# Patient Record
Sex: Male | Born: 1965 | Race: Black or African American | Hispanic: No | Smoking: Current every day smoker
Health system: Southern US, Community
[De-identification: ages and names within clinical notes are randomized; demographics above are authoritative.]

## PROBLEM LIST (undated history)

## (undated) DIAGNOSIS — I10 Essential (primary) hypertension: Secondary | ICD-10-CM

## (undated) DIAGNOSIS — R000 Tachycardia, unspecified: Secondary | ICD-10-CM

## (undated) DIAGNOSIS — R1310 Dysphagia, unspecified: Secondary | ICD-10-CM

## (undated) DIAGNOSIS — I610 Nontraumatic intracerebral hemorrhage in hemisphere, subcortical: Secondary | ICD-10-CM

## (undated) DIAGNOSIS — R471 Dysarthria and anarthria: Secondary | ICD-10-CM

## (undated) DIAGNOSIS — I714 Abdominal aortic aneurysm, without rupture, unspecified: Secondary | ICD-10-CM

## (undated) DIAGNOSIS — R0682 Tachypnea, not elsewhere classified: Secondary | ICD-10-CM

## (undated) HISTORY — PX: APPENDECTOMY: SHX54

## (undated) HISTORY — PX: ABDOMINAL SURGERY: SHX537

---

## 2014-06-11 ENCOUNTER — Emergency Department (HOSPITAL_COMMUNITY): Payer: Non-veteran care

## 2014-06-11 ENCOUNTER — Inpatient Hospital Stay (HOSPITAL_COMMUNITY): Payer: Non-veteran care

## 2014-06-11 ENCOUNTER — Encounter (HOSPITAL_COMMUNITY): Payer: Self-pay | Admitting: Emergency Medicine

## 2014-06-11 ENCOUNTER — Inpatient Hospital Stay (HOSPITAL_COMMUNITY)
Admission: EM | Admit: 2014-06-11 | Discharge: 2014-06-14 | DRG: 066 | Disposition: A | Discharge status: Home / Self Care | Payer: Non-veteran care | Attending: Neurology | Admitting: Neurology

## 2014-06-11 DIAGNOSIS — I714 Abdominal aortic aneurysm, without rupture: Secondary | ICD-10-CM | POA: Diagnosis present

## 2014-06-11 DIAGNOSIS — E669 Obesity, unspecified: Secondary | ICD-10-CM | POA: Diagnosis present

## 2014-06-11 DIAGNOSIS — E785 Hyperlipidemia, unspecified: Secondary | ICD-10-CM | POA: Diagnosis not present

## 2014-06-11 DIAGNOSIS — I6789 Other cerebrovascular disease: Secondary | ICD-10-CM

## 2014-06-11 DIAGNOSIS — H532 Diplopia: Secondary | ICD-10-CM | POA: Diagnosis not present

## 2014-06-11 DIAGNOSIS — H52511 Internal ophthalmoplegia (complete) (total), right eye: Secondary | ICD-10-CM | POA: Diagnosis present

## 2014-06-11 DIAGNOSIS — Z6833 Body mass index (BMI) 33.0-33.9, adult: Secondary | ICD-10-CM | POA: Diagnosis not present

## 2014-06-11 DIAGNOSIS — Z720 Tobacco use: Secondary | ICD-10-CM | POA: Diagnosis not present

## 2014-06-11 DIAGNOSIS — I619 Nontraumatic intracerebral hemorrhage, unspecified: Secondary | ICD-10-CM

## 2014-06-11 DIAGNOSIS — Z9119 Patient's noncompliance with other medical treatment and regimen: Secondary | ICD-10-CM | POA: Diagnosis present

## 2014-06-11 DIAGNOSIS — F17210 Nicotine dependence, cigarettes, uncomplicated: Secondary | ICD-10-CM | POA: Diagnosis present

## 2014-06-11 DIAGNOSIS — I610 Nontraumatic intracerebral hemorrhage in hemisphere, subcortical: Secondary | ICD-10-CM | POA: Diagnosis not present

## 2014-06-11 DIAGNOSIS — I613 Nontraumatic intracerebral hemorrhage in brain stem: Principal | ICD-10-CM

## 2014-06-11 DIAGNOSIS — I10 Essential (primary) hypertension: Secondary | ICD-10-CM | POA: Diagnosis present

## 2014-06-11 DIAGNOSIS — Z9114 Patient's other noncompliance with medication regimen: Secondary | ICD-10-CM | POA: Diagnosis present

## 2014-06-11 DIAGNOSIS — R200 Anesthesia of skin: Secondary | ICD-10-CM | POA: Diagnosis present

## 2014-06-11 HISTORY — DX: Essential (primary) hypertension: I10

## 2014-06-11 LAB — CBC WITH DIFFERENTIAL/PLATELET
Basophils Absolute: 0.1 10*3/uL (ref 0.0–0.1)
Basophils Relative: 1 % (ref 0–1)
EOS ABS: 0.2 10*3/uL (ref 0.0–0.7)
Eosinophils Relative: 3 % (ref 0–5)
HCT: 45.8 % (ref 39.0–52.0)
Hemoglobin: 15.6 g/dL (ref 13.0–17.0)
Lymphocytes Relative: 23 % (ref 12–46)
Lymphs Abs: 1.4 10*3/uL (ref 0.7–4.0)
MCH: 31.4 pg (ref 26.0–34.0)
MCHC: 34.1 g/dL (ref 30.0–36.0)
MCV: 92.2 fL (ref 78.0–100.0)
MONO ABS: 0.7 10*3/uL (ref 0.1–1.0)
MONOS PCT: 12 % (ref 3–12)
NEUTROS PCT: 61 % (ref 43–77)
Neutro Abs: 3.7 10*3/uL (ref 1.7–7.7)
PLATELETS: 279 10*3/uL (ref 150–400)
RBC: 4.97 MIL/uL (ref 4.22–5.81)
RDW: 14.8 % (ref 11.5–15.5)
WBC: 6 10*3/uL (ref 4.0–10.5)

## 2014-06-11 LAB — URINALYSIS, ROUTINE W REFLEX MICROSCOPIC
Bilirubin Urine: NEGATIVE
GLUCOSE, UA: NEGATIVE mg/dL
Ketones, ur: NEGATIVE mg/dL
Leukocytes, UA: NEGATIVE
NITRITE: NEGATIVE
PROTEIN: 100 mg/dL — AB
Specific Gravity, Urine: 1.013 (ref 1.005–1.030)
UROBILINOGEN UA: 0.2 mg/dL (ref 0.0–1.0)
pH: 6 (ref 5.0–8.0)

## 2014-06-11 LAB — COMPREHENSIVE METABOLIC PANEL
ALT: 24 U/L (ref 0–53)
ANION GAP: 9 (ref 5–15)
AST: 27 U/L (ref 0–37)
Albumin: 3.6 g/dL (ref 3.5–5.2)
Alkaline Phosphatase: 77 U/L (ref 39–117)
BUN: 11 mg/dL (ref 6–23)
CALCIUM: 8.9 mg/dL (ref 8.4–10.5)
CO2: 27 mmol/L (ref 19–32)
Chloride: 101 mmol/L (ref 96–112)
Creatinine, Ser: 1.25 mg/dL (ref 0.50–1.35)
GFR calc Af Amer: 77 mL/min — ABNORMAL LOW (ref >90)
GFR, EST NON AFRICAN AMERICAN: 67 mL/min — AB (ref >90)
Glucose, Bld: 109 mg/dL — ABNORMAL HIGH (ref 70–99)
Potassium: 3.4 mmol/L — ABNORMAL LOW (ref 3.5–5.1)
SODIUM: 137 mmol/L (ref 135–145)
TOTAL PROTEIN: 7.4 g/dL (ref 6.0–8.3)
Total Bilirubin: 0.7 mg/dL (ref 0.3–1.2)

## 2014-06-11 LAB — RAPID URINE DRUG SCREEN, HOSP PERFORMED
Amphetamines: NOT DETECTED
BARBITURATES: NOT DETECTED
BENZODIAZEPINES: NOT DETECTED
COCAINE: NOT DETECTED
Opiates: NOT DETECTED
Tetrahydrocannabinol: NOT DETECTED

## 2014-06-11 LAB — MRSA PCR SCREENING: MRSA BY PCR: NEGATIVE

## 2014-06-11 LAB — ETHANOL

## 2014-06-11 LAB — URINE MICROSCOPIC-ADD ON

## 2014-06-11 LAB — PROTIME-INR
INR: 0.92 (ref 0.00–1.49)
Prothrombin Time: 12.5 seconds (ref 11.6–15.2)

## 2014-06-11 LAB — TROPONIN I

## 2014-06-11 MED ORDER — ONDANSETRON HCL 4 MG/2ML IJ SOLN
4.0000 mg | Freq: Three times a day (TID) | INTRAMUSCULAR | Status: DC | PRN
  Administered 2014-06-11: 4 mg via INTRAVENOUS
  Filled 2014-06-11: qty 2

## 2014-06-11 MED ORDER — LABETALOL HCL 5 MG/ML IV SOLN: 10.0000 mg | INTRAVENOUS | Status: DC | PRN

## 2014-06-11 MED ORDER — PANTOPRAZOLE SODIUM 40 MG IV SOLR
40.0000 mg | Freq: Every day | INTRAVENOUS | Status: DC
Start: 2014-06-11 — End: 2014-06-12
  Administered 2014-06-11: 40 mg via INTRAVENOUS
  Filled 2014-06-11 (×2): qty 40

## 2014-06-11 MED ORDER — ACETAMINOPHEN 650 MG RE SUPP: 650.0000 mg | RECTAL | Status: DC | PRN

## 2014-06-11 MED ORDER — LISINOPRIL 10 MG PO TABS
10.0000 mg | ORAL_TABLET | Freq: Every day | ORAL | Status: DC
  Administered 2014-06-11 – 2014-06-12 (×2): 10 mg via ORAL
  Filled 2014-06-11 (×2): qty 1

## 2014-06-11 MED ORDER — NICARDIPINE HCL IN NACL 20-0.86 MG/200ML-% IV SOLN
3.0000 mg/h | INTRAVENOUS | Status: DC
  Administered 2014-06-11: 5 mg/h via INTRAVENOUS
  Administered 2014-06-11: 3 mg/h via INTRAVENOUS
  Filled 2014-06-11 (×5): qty 200

## 2014-06-11 MED ORDER — SENNOSIDES-DOCUSATE SODIUM 8.6-50 MG PO TABS
1.0000 | ORAL_TABLET | Freq: Two times a day (BID) | ORAL | Status: DC
  Administered 2014-06-11 – 2014-06-14 (×6): 1 via ORAL
  Filled 2014-06-11 (×8): qty 1

## 2014-06-11 MED ORDER — LABETALOL HCL 5 MG/ML IV SOLN
10.0000 mg | Freq: Once | INTRAVENOUS | Status: AC
  Administered 2014-06-11: 10 mg via INTRAVENOUS
  Filled 2014-06-11: qty 4

## 2014-06-11 MED ORDER — ACETAMINOPHEN 325 MG PO TABS: 650.0000 mg | ORAL_TABLET | ORAL | Status: DC | PRN

## 2014-06-11 MED ORDER — STROKE: EARLY STAGES OF RECOVERY BOOK
Freq: Once | Status: AC
Start: 2014-06-11 — End: 2014-06-11
  Administered 2014-06-11: 1
  Filled 2014-06-11: qty 1

## 2014-06-11 MED ORDER — NICARDIPINE HCL IN NACL 20-0.86 MG/200ML-% IV SOLN
3.0000 mg/h | Freq: Once | INTRAVENOUS | Status: AC
  Administered 2014-06-11: 5 mg/h via INTRAVENOUS
  Filled 2014-06-11: qty 200

## 2014-06-11 MED ORDER — CLONIDINE HCL 0.1 MG PO TABS
0.1000 mg | ORAL_TABLET | Freq: Two times a day (BID) | ORAL | Status: DC
  Administered 2014-06-11 – 2014-06-12 (×3): 0.1 mg via ORAL
  Filled 2014-06-11 (×4): qty 1

## 2014-06-11 NOTE — Progress Notes (Signed)
Patient brought to me from MRI. Cardene drip not running and primed through secondary tubing. Compliant of nausea and progressed to vomited 300cc. NIH, complaint of headache 3 out of 10. Contacted MD due to concerns for ?increaseing ICP based on above findings. Although patient alert and oriented. MD has reviewed readings and per MD. Based on the location of CVA, patient will experience nausea and vomiting. Per MD, give zofran for now and restart cardene based on current parameters.

## 2014-06-11 NOTE — H&P (Addendum)
Stroke Consult    Chief Complaint: numbness HPI: Andre [name] is an 49 y.o. male hx of HTN presenting to the ED for right sided numbness involving face and arm. States he has pain on the right side of his face. Denies any weakness. Denies any speech issues. Notes difficulty with moving his eyes to the right. No prior CVA or TIA. Notes history of HTN but is non-compliant with medications.   CT head in the ED completed and reviewed. Shows 9mm ICH in the posterior pons and midbrain. BP has remained elevated in the ED, ranging SBP 188 to 198.    Past Medical History  Diagnosis Date  . Hypertension     Past Surgical History  Procedure Laterality Date  . Appendectomy    . Abdominal surgery      No family history on file. Social History:  reports that he has been smoking.  He does not have any smokeless tobacco history on file. He reports that he drinks alcohol. He reports that he does not use illicit drugs.  Allergies: No Known Allergies   (Not in a hospital admission)  ROS: Out of a complete 14 system review, the patient complains of only the following symptoms, and all other reviewed systems are negative. +paresthesias, weakness   Physical Examination: Filed Vitals:   06/11/14 0400  BP: 197/117  Pulse: 77  Temp:   Resp: 20   Physical Exam  Constitutional: He appears well-developed and well-nourished.  Psych: Affect appropriate to situation Eyes: No scleral injection HENT: No OP obstrucion Head: Normocephalic.  Cardiovascular: Normal rate and regular rhythm.  Respiratory: Effort normal and breath sounds normal.  GI: Soft. Bowel sounds are normal. No distension. There is no tenderness.  Skin: WDI  Neurologic Examination: Mental Status: Lethargic but easily aroused by voice, oriented, thought content appropriate.  Limited speech but appears fluent without evidence of aphasia.  Mild dysarthria.  Cranial Nerves: II: optic discs not visualized, visual fields  grossly normal, pupils equal, round, reactive to light  III,IV, VI: mild bilateral ptosis, left gaze preference, intermittently appears able to move eyes lateral to the right V,VII: left NLF flattening, facial light touch sensation normal bilaterally VIII: hearing normal bilaterally IX,X: gag reflex present XI: trapezius strength/neck flexion strength normal bilaterally XII: tongue strength normal  Motor: Right : Upper extremity    Left:     Upper extremity 5-/5 deltoid       5/5 deltoid 5-/5 biceps      5/5 biceps  5/5 triceps      5/5 triceps 5/5 hand grip      5/5 hand grip  Lower extremity     Lower extremity 5/5 hip flexor      5/5 hip flexor 5/5 quadricep      5/5 quadriceps  5/5 hamstrings     5/5 hamstrings 5/5 plantar flexion       5/5 plantar flexion 5/5 plantar extension     5/5 plantar extension Tone and bulk:normal tone throughout; no atrophy noted Sensory: decreased LT on the right Deep Tendon Reflexes: 2+ biceps, triceps and patellar Plantars: Right: downgoing   Left: downgoing Cerebellar: normal finger-to-nose, and normal heel-to-shin test Gait: deferred  Laboratory Studies:   Basic Metabolic Panel:  Recent Labs Lab 06/11/14 0341  NA 137  K 3.4*  CL 101  CO2 27  GLUCOSE 109*  BUN PENDING  CREATININE 1.25  CALCIUM 8.9    Liver Function Tests:  Recent Labs Lab 06/11/14 0341  AST  27  ALT 24  ALKPHOS 77  BILITOT 0.7  PROT 7.4  ALBUMIN 3.6   No results for input(s): LIPASE, AMYLASE in the last 168 hours. No results for input(s): AMMONIA in the last 168 hours.  CBC:  Recent Labs Lab 06/11/14 0341  WBC 6.0  NEUTROABS 3.7  HGB 15.6  HCT 45.8  MCV 92.2  PLT 279    Cardiac Enzymes:  Recent Labs Lab 06/11/14 0341  TROPONINI <0.03    BNP: Invalid input(s): POCBNP  CBG: No results for input(s): GLUCAP in the last 168 hours.  Microbiology: No results found for this or any previous visit.  Coagulation Studies:  Recent Labs   06/11/14 0341  LABPROT 12.5  INR 0.92    Urinalysis: No results for input(s): COLORURINE, LABSPEC, PHURINE, GLUCOSEU, HGBUR, BILIRUBINUR, KETONESUR, PROTEINUR, UROBILINOGEN, NITRITE, LEUKOCYTESUR in the last 168 hours.  Invalid input(s): APPERANCEUR  Lipid Panel:  No results found for: CHOL, TRIG, HDL, CHOLHDL, VLDL, LDLCALC  HgbA1C: No results found for: HGBA1C  Urine Drug Screen:  No results found for: LABOPIA, COCAINSCRNUR, LABBENZ, AMPHETMU, THCU, LABBARB  Alcohol Level:  Recent Labs Lab 06/11/14 0341  ETH <5    Other results: EKG: normal EKG, normal sinus rhythm.  Imaging: Ct Head Wo Contrast  06/11/2014   CLINICAL DATA:  Numbness in the right face and left arm.  EXAM: CT HEAD WITHOUT CONTRAST  TECHNIQUE: Contiguous axial images were obtained from the base of the skull through the vertex without intravenous contrast.  COMPARISON:  None.  FINDINGS: 9 mm diameter focal intraparenchymal hemorrhage in the right posterior pons and midbrain. This likely represents a focal hemorrhagic infarct. No mass effect or midline shift. Diffuse cerebral atrophy. Mild ventricular dilatation consistent with central atrophy. Low-attenuation changes in the deep white matter consistent small vessel ischemia. No abnormal extra-axial fluid collections. Gray-white matter junctions are distinct. Basal cisterns are not effaced. Calvarium is intact. Visualized paranasal sinuses and mastoid air cells are not opacified.  IMPRESSION: Focal acute intraparenchymal hemorrhage in the posterior pons and midbrain. This is likely represent focal hemorrhagic infarct. Chronic atrophy and small vessel ischemic changes.  These results were called by telephone at the time of interpretation on 06/11/2014 at 3:58 am to Dr. Loren RacerAVID YELVERTON , who verbally acknowledged these results.   Electronically Signed   By: Burman NievesWilliam  Stevens M.D.   On: 06/11/2014 04:00    Assessment: 49 y.o. male hx of HTN, not compliant with medications,  presenting with acute onset of right sided paresthesia and occular motility dysfunction. CT head in ED shows acute ICH in posterior pons/midbrain region. Suspect hypertensive etiology.    Plan: 1) Admit to ICU 2)MRI/A of brain without contrast 3) no antiplatelets or anticoagulants 4) blood pressure control with goal systolic <160. Started on cardene drip in ED 5) Frequent neuro checks 6) If symptoms worsen or there is decreased mental status, repeat stat head CT 7) PT,OT,ST 8) Echocardiogram 9) Carotid dopplers   This patient is critically ill and at significant risk of neurological worsening, death and care requires constant monitoring of vital signs, hemodynamics,respiratory and cardiac monitoring,review of multiple databases, neurological assessment, discussion with family, other specialists and medical decision making of high complexity. I spent 45 inutes of neurocritical care time in the care of this patient.   Elspeth Choeter Terez Montee, DO [facility] [phone]  If 7pm- 7am, please page neurology on call as listed in AMION. 06/11/2014, 4:27 AM

## 2014-06-11 NOTE — Progress Notes (Signed)
Stroke Team Progress Note  HISTORY Zenovia Jarredheodore W Clere is an 49 y.o. male hx of HTN presenting to the ED for right sided numbness involving face and arm. States he has pain on the right side of his face. Denies any weakness. Denies any speech issues. Notes difficulty with moving his eyes to the right. No prior CVA or TIA. Notes history of HTN but is non-compliant with medications.   CT head in the ED completed and reviewed. Shows 9mm ICH in the posterior pons and midbrain. BP has remained elevated in the ED, ranging SBP 188 to 198.   No TPA due to ICH.  SUBJECTIVE No family is at bedside. The patient had some nausea and vomiting last evening, feels well at this time. No significant headache. He reports numbness on the right face, left arm.  The patient indicates [facility], he has been off 2 blood pressure medications for least 6 months.  OBJECTIVE Most recent Vital Signs: Filed Vitals:   06/11/14 0900 06/11/14 0930 06/11/14 1000 06/11/14 1030  BP: 131/82 121/75 135/87 135/88  Pulse: 91 80 84 81  Temp:      TempSrc:      Resp: 21 19 20 19   Height:      Weight:      SpO2: 98% 98% 98% 99%   CBG (last 3)  No results for input(s): GLUCAP in the last 72 hours.  IV Fluid Intake:   . niCARDipine 3 mg/hr (06/11/14 0900)    MEDICATIONS  .  stroke: mapping our early stages of recovery book   Does not apply Once  . pantoprazole (PROTONIX) IV  40 mg Intravenous QHS  . senna-docusate  1 tablet Oral BID   PRN:  acetaminophen **OR** acetaminophen, labetalol, ondansetron  Diet:  Heart healthy diet Activity:  Bedrest DVT Prophylaxis:  SCD  CLINICALLY SIGNIFICANT STUDIES Basic Metabolic Panel:  Recent Labs Lab 06/11/14 0341  NA 137  K 3.4*  CL 101  CO2 27  GLUCOSE 109*  BUN 11  CREATININE 1.25  CALCIUM 8.9   Liver Function Tests:  Recent Labs Lab 06/11/14 0341  AST 27  ALT 24  ALKPHOS 77  BILITOT 0.7  PROT 7.4  ALBUMIN 3.6   CBC:   Recent Labs Lab 06/11/14 0341  WBC 6.0  NEUTROABS 3.7  HGB 15.6  HCT 45.8  MCV 92.2  PLT 279   Coagulation:  Recent Labs Lab 06/11/14 0341  LABPROT 12.5  INR 0.92   Cardiac Enzymes:  Recent Labs Lab 06/11/14 0341  TROPONINI <0.03   Urinalysis:  Recent Labs Lab 06/11/14 0410  COLORURINE YELLOW  LABSPEC 1.013  PHURINE 6.0  GLUCOSEU NEGATIVE  HGBUR TRACE*  BILIRUBINUR NEGATIVE  KETONESUR NEGATIVE  PROTEINUR 100*  UROBILINOGEN 0.2  NITRITE NEGATIVE  LEUKOCYTESUR NEGATIVE   Lipid PanelNo results found for: CHOL, TRIG, HDL, CHOLHDL, VLDL, LDLCALC HgbA1C No results found for: HGBA1C  Urine Drug Screen:     Component Value Date/Time   LABOPIA NONE DETECTED 06/11/2014 0410   COCAINSCRNUR NONE DETECTED 06/11/2014 0410   LABBENZ NONE DETECTED 06/11/2014 0410   AMPHETMU NONE DETECTED 06/11/2014 0410   THCU NONE DETECTED 06/11/2014 0410   LABBARB NONE DETECTED 06/11/2014 0410    Alcohol Level:  Recent Labs Lab 06/11/14 0341  ETH <5    Ct Head Wo Contrast  06/11/2014   CLINICAL DATA:  Numbness in the right face and left arm.  EXAM: CT HEAD WITHOUT CONTRAST  TECHNIQUE: Contiguous axial images were obtained from the base of the skull through the vertex without intravenous contrast.  COMPARISON:  None.  FINDINGS: 9 mm diameter focal intraparenchymal hemorrhage in the right posterior pons and midbrain. This likely represents a focal hemorrhagic infarct. No mass effect or midline shift. Diffuse cerebral atrophy. Mild ventricular dilatation consistent with central atrophy. Low-attenuation changes in the deep white matter consistent small vessel ischemia. No abnormal extra-axial fluid collections. Gray-white matter junctions are distinct. Basal cisterns are not effaced. Calvarium is intact. Visualized paranasal sinuses and mastoid air cells are not opacified.  IMPRESSION: Focal acute intraparenchymal hemorrhage in the posterior pons and midbrain. This is likely represent focal  hemorrhagic infarct. Chronic atrophy and small vessel ischemic changes.  These results were called by telephone at the time of interpretation on 06/11/2014 at 3:58 am to Dr. Loren Racer , who verbally acknowledged these results.   Electronically Signed   By: Burman Nieves M.D.   On: 06/11/2014 04:00   Mr Maxine Glenn Head Wo Contrast  06/11/2014   CLINICAL DATA:  Initial evaluation for acute right-sided numbness involving face and arm. Also with difficulty moving eyes to right. History of hypertension. Prior CT performed earlier today demonstrated intracranial hemorrhage within the posterior right midbrain/pons.  EXAM: MRI HEAD WITHOUT CONTRAST  MRA HEAD WITHOUT CONTRAST  TECHNIQUE: Multiplanar, multiecho pulse sequences of the brain and surrounding structures were obtained without intravenous contrast. Angiographic images of the head were obtained using MRA technique without contrast.  COMPARISON:  Prior CT performed earlier on the same day, not available for review at the time of this dictation due to down time.  FINDINGS: MRI HEAD FINDINGS  Cerebral volume within normal limits for patient age. Patchy and confluent T2/FLAIR hyperintensity within the periventricular and deep white matter of both cerebral hemispheres present, most compatible with chronic small vessel ischemic changes. Small vessel type changes present within the pons as well. Remote lacunar infarct present within the basal ganglia bilaterally as well as the pons.  Previously identified parenchymal hemorrhage within the dorsal aspect of the pons/midbrain again seen, compatible with acute parenchymal hemorrhage (series 10, image 8). This measures 10 x 8 mm on this study. Finding likely represents a hypertensive bleed. There is mild associated localized vasogenic edema. The fourth ventricle posteriorly remains widely patent. No hydrocephalus. Additional chronic blood products associated with several of the remote lacunar infarct present within the  adjacent pons of as well as the bilateral basal ganglia.  No mass lesion or midline shift.  No extra-axial fluid collection.  Craniocervical junction within normal limits. Mild degenerative changes noted within the visualized upper cervical spine. Pituitary gland normal. No acute abnormality about the orbits.  Mild mucoperiosteal thickening present within the ethmoidal air cells. Paranasal sinuses are otherwise clear. No mastoid effusion. Inner ear structures normal.  Bone marrow signal intensity within normal limits.  MRA HEAD FINDINGS  ANTERIOR CIRCULATION:  Visualized distal cervical segments of the internal carotid arteries are widely patent with antegrade flow. The petrous, cavernous, and supra clinoid segments are well opacified. A1 segments widely patent bilaterally. Anterior cerebral arteries well opacified.  M1 segments widely patent without stenosis or occlusion. MCA bifurcations normal. Distal MCA branches well opacified and symmetric bilaterally.  POSTERIOR CIRCULATION:  Both vertebral arteries are well opacified to the vertebrobasilar junction. The left vertebral artery is slightly dominant. Posterior inferior cerebellar arteries not well evaluated on this exam. Basilar artery widely patent without stenosis or occlusion. Superior cerebellar arteries opacified proximally. Posterior cerebral  arteries widely patent without stenosis or occlusion.  No aneurysm or vascular malformation.  IMPRESSION: MRI HEAD IMPRESSION:  1. 10 x 8 mm acute parenchymal hemorrhage within the dorsal right midbrain/pons. Underlying hypertensive bleed is suspected. 2. Additional multiple remote lacunar infarcts involving the bilateral basal ganglia and pons. 3. Moderate chronic small vessel ischemic disease.  MRA HEAD IMPRESSION:  Unremarkable MRA of the intracranial circulation without significant stenosis or arterial occlusion.   Electronically Signed   By: Rise Mu M.D.   On: 06/11/2014 06:36   Mr Brain Wo  Contrast  06/11/2014   CLINICAL DATA:  Initial evaluation for acute right-sided numbness involving face and arm. Also with difficulty moving eyes to right. History of hypertension. Prior CT performed earlier today demonstrated intracranial hemorrhage within the posterior right midbrain/pons.  EXAM: MRI HEAD WITHOUT CONTRAST  MRA HEAD WITHOUT CONTRAST  TECHNIQUE: Multiplanar, multiecho pulse sequences of the brain and surrounding structures were obtained without intravenous contrast. Angiographic images of the head were obtained using MRA technique without contrast.  COMPARISON:  Prior CT performed earlier on the same day, not available for review at the time of this dictation due to down time.  FINDINGS: MRI HEAD FINDINGS  Cerebral volume within normal limits for patient age. Patchy and confluent T2/FLAIR hyperintensity within the periventricular and deep white matter of both cerebral hemispheres present, most compatible with chronic small vessel ischemic changes. Small vessel type changes present within the pons as well. Remote lacunar infarct present within the basal ganglia bilaterally as well as the pons.  Previously identified parenchymal hemorrhage within the dorsal aspect of the pons/midbrain again seen, compatible with acute parenchymal hemorrhage (series 10, image 8). This measures 10 x 8 mm on this study. Finding likely represents a hypertensive bleed. There is mild associated localized vasogenic edema. The fourth ventricle posteriorly remains widely patent. No hydrocephalus. Additional chronic blood products associated with several of the remote lacunar infarct present within the adjacent pons of as well as the bilateral basal ganglia.  No mass lesion or midline shift.  No extra-axial fluid collection.  Craniocervical junction within normal limits. Mild degenerative changes noted within the visualized upper cervical spine. Pituitary gland normal. No acute abnormality about the orbits.  Mild  mucoperiosteal thickening present within the ethmoidal air cells. Paranasal sinuses are otherwise clear. No mastoid effusion. Inner ear structures normal.  Bone marrow signal intensity within normal limits.  MRA HEAD FINDINGS  ANTERIOR CIRCULATION:  Visualized distal cervical segments of the internal carotid arteries are widely patent with antegrade flow. The petrous, cavernous, and supra clinoid segments are well opacified. A1 segments widely patent bilaterally. Anterior cerebral arteries well opacified.  M1 segments widely patent without stenosis or occlusion. MCA bifurcations normal. Distal MCA branches well opacified and symmetric bilaterally.  POSTERIOR CIRCULATION:  Both vertebral arteries are well opacified to the vertebrobasilar junction. The left vertebral artery is slightly dominant. Posterior inferior cerebellar arteries not well evaluated on this exam. Basilar artery widely patent without stenosis or occlusion. Superior cerebellar arteries opacified proximally. Posterior cerebral arteries widely patent without stenosis or occlusion.  No aneurysm or vascular malformation.  IMPRESSION: MRI HEAD IMPRESSION:  1. 10 x 8 mm acute parenchymal hemorrhage within the dorsal right midbrain/pons. Underlying hypertensive bleed is suspected. 2. Additional multiple remote lacunar infarcts involving the bilateral basal ganglia and pons. 3. Moderate chronic small vessel ischemic disease.  MRA HEAD IMPRESSION:  Unremarkable MRA of the intracranial circulation without significant stenosis or arterial occlusion.   Electronically Signed  By: Rise Mu M.D.   On: 06/11/2014 06:36    CT of the brain   IMPRESSION: Focal acute intraparenchymal hemorrhage in the posterior pons and midbrain. This is likely represent focal hemorrhagic infarct. Chronic atrophy and small vessel ischemic changes. MRI of the brain   IMPRESSION: MRI HEAD IMPRESSION:  1. 10 x 8 mm acute parenchymal hemorrhage within the dorsal  right midbrain/pons. Underlying hypertensive bleed is suspected. 2. Additional multiple remote lacunar infarcts involving the bilateral basal ganglia and pons. 3. Moderate chronic small vessel ischemic disease.  MRA HEAD IMPRESSION:  Unremarkable MRA of the intracranial circulation without significant stenosis or arterial occlusion.  MRA of the brain   See above 2D Echocardiogram    Carotid Doppler    CXR    EKG  Probable left atrial enlargement RSR' in V1 or V2, right VCD or RVH Left ventricular hypertrophy ST depr, consider ischemia, inferior leads ST elevation, consider anterior injury  Therapy Recommendations pending  Physical Exam  General: The patient is alert and cooperative at the time of the examination.  Respiratory: Lung fields are clear  Cardiovascular: Regular rate and rhythm, no murmurs  Abdomen: Soft, nontender, positive bowel sounds  Skin: No significant peripheral edema is noted.   Neurologic Exam  Mental status: The patient is alert and oriented x 3 at the time of the examination. The patient has apparent normal recent and remote memory, with an apparently normal attention span and concentration ability.   Cranial nerves: Facial symmetry is present. Speech is slightly dysarthric, no aphasia. Extraocular movements are full, with exception of incomplete abduction of the right eye with right lateral gaze. Visual fields are full.  Motor: The patient has good strength in all 4 extremities.  Sensory examination: Soft touch sensation is symmetric on the face, arms, and legs.  Coordination: The patient has good finger-nose-finger and heel-to-shin bilaterally.  Gait and station: The gait was not tested.  Reflexes: Deep tendon reflexes are symmetric.    ASSESSMENT Mr. ACHILLIES BUEHL is a 49 y.o. male presenting with a right pontine intracranial hemorrhage. The patient was noncompliant on blood pressure medications, off medications for 6  months. The patient presents with subjective numbness on the right face, left arm. The patient has ophthalmoplegia, a central right sixth nerve palsy. TPA was not administered secondary to intracranial hemorrhage. The patient was not on antiplatelet agents prior to coming in the hospital. Urine drug screen was negative.   Right pontine hemorrhage  Hypertension  Medical noncompliance  Hospital day # 0  TREATMENT/PLAN  Supportive care  Observe in intensive care unit overnight, and his blood pressures  Cardene drip  Add oral blood pressure medication  Physical, occupational therapy evaluation  Repeat head scan for clinical worsening  Possible transfer out of the intensive care unit tomorrow if the patient remains stable  WILLIS,CHARLES KEITH  06/11/2014 10:38 AM  [phone]

## 2014-06-11 NOTE — ED Notes (Addendum)
Pt c/o right side of face numb, left arm numb, when asked to smile-- states face "hurts" on right side--pt states "my eyes keep going to left side" -- is able to look forward-- equal grips-- speech clear--face drooping on left side but states feels swollen on right side

## 2014-06-11 NOTE — Progress Notes (Signed)
eLink Physician-Brief Progress Note Patient Name: Andre [name] DOB: 09/03/1965 MRN: [id number]   Date of Service  06/11/2014  HPI/Events of Note  N/V  eICU Interventions  Zofran 4 mg IV q8 hours prn N/V     Intervention Category Minor Interventions: Routine modifications to care plan (e.g. PRN medications for pain, fever)  Thurl Boen 06/11/2014, 6:39 AM

## 2014-06-11 NOTE — Progress Notes (Signed)
eLink Physician-Brief Progress Note Patient Name: Zenovia Jarredheodore W Edgar DOB: [birth year] MRN: [id number]   Date of Service  06/11/2014  HPI/Events of Note  [age] M with PMH of HTN presenting with s/s of CVA and findings of a focal IP hemorrhage in posterior pons and midbrain.  Patient is hypertensive on cardene gtt with prn labetalol.  He is currently vomiting.  eICU Interventions  Plan of care per neurology PRN zofran ordered for N/V [facility]     Intervention Category Evaluation Type: New Patient Evaluation  Diane Mochizuki 06/11/2014, 6:44 AM

## 2014-06-11 NOTE — Plan of Care (Signed)
Problem: Progression Outcomes Goal: Hemodynamically stable Patient is on Cardene drip, patient will be kept within ordered parameters.

## 2014-06-11 NOTE — ED Provider Notes (Signed)
CSN: [id number]     Arrival date & time 06/11/14  0227 History   First MD Initiated Contact with Patient 06/11/14 0326     Chief Complaint  Patient presents with  . Numbness   The history is provided by the patient. No language interpreter was used.    This chart was scribed for York Spaniel, MD by Andrew Au, ED Scribe. This patient was seen in room 2M09C/2M09C-01 and the patient's care was started at 6:37 AM.  HPI Comments:  Andre [name] is a 49 y.o. male who present to the Emergency Department complaining of numbness. Pt states he was driving home when he began to have develop numbness to entire face that  gradually worsen to left arm. He thinks the symptoms started around 11 PM. He reports associated nausea without emesis.  [facility]. Pt admits to drinking a couple of beers and a glass of wine tonight as well as smoking cigarettes. He denies drug use including marijuana. Pt has hx of HTN but does not take medication regularly.    Past Medical History  Diagnosis Date  . Hypertension    Past Surgical History  Procedure Laterality Date  . Appendectomy    . Abdominal surgery     No family history on file. History  Substance Use Topics  . Smoking status: Current Every Day Smoker -- 1.00 packs/day  . Smokeless tobacco: Not on file  . Alcohol Use: Yes     Comment: 40 oz beer/day    Review of Systems  Constitutional: Negative for fever and chills.  Eyes: Negative for visual disturbance.  Respiratory: Negative for shortness of breath.   Gastrointestinal: Positive for nausea. Negative for vomiting and abdominal pain.  Musculoskeletal: Negative for back pain, neck pain and neck stiffness.  Neurological: Positive for numbness. Negative for dizziness, weakness, light-headedness and headaches.  All other systems reviewed and are negative.     Allergies  Review of patient's allergies indicates no known  allergies.  Home Medications   Prior to Admission medications   Not on File   BP 153/102 mmHg  Pulse 82  Temp(Src) 98 F (36.7 C) (Oral)  Resp 17  SpO2 97% Physical Exam  Constitutional: He is oriented to person, place, and time. He appears well-developed and well-nourished. No distress.  HENT:  Head: Normocephalic and atraumatic.  Mouth/Throat: Oropharynx is clear and moist.  Eyes: EOM are normal. Pupils are equal, round, and reactive to light.  Bilateral injected sclera  Neck: Normal range of motion. Neck supple.  Cardiovascular: Normal rate and regular rhythm.  Exam reveals no gallop and no friction rub.   No murmur heard. Pulmonary/Chest: Effort normal and breath sounds normal. No respiratory distress. He has no wheezes. He has no rales.  Abdominal: Soft. Bowel sounds are normal. He exhibits no distension and no mass. There is no tenderness. There is no rebound and no guarding.  Musculoskeletal: Normal range of motion. He exhibits no edema or tenderness.  Neurological: He is alert and oriented to person, place, and time.  Patient's neuro exam appears to be effort dependent. No drift in any extremity. Diffuse decreased sensation to the left upper extremity. Patient also has decreased sensation throughout the entire face. No facial droop noted. Patient with left gaze preference but is able to look to the right.  Skin: Skin is warm and dry. No rash noted. No erythema.  Psychiatric: He has  a normal mood and affect. His behavior is normal.  Nursing note and vitals reviewed.   ED Course  Procedures (including critical care time) DIAGNOSTIC STUDIES: Oxygen Saturation is 98% on RA, normal by my interpretation.    COORDINATION OF CARE: 6:37 AM- Pt advised of plan for treatment and pt agrees.  Labs Review Labs Reviewed  COMPREHENSIVE METABOLIC PANEL - Abnormal; Notable for the following:    Potassium 3.4 (*)    Glucose, Bld 109 (*)    GFR calc non Af Amer 67 (*)    GFR calc  Af Amer 77 (*)    All other components within normal limits  URINALYSIS, ROUTINE W REFLEX MICROSCOPIC - Abnormal; Notable for the following:    Hgb urine dipstick TRACE (*)    Protein, ur 100 (*)    All other components within normal limits  CBC WITH DIFFERENTIAL/PLATELET  TROPONIN I  PROTIME-INR  URINE RAPID DRUG SCREEN (HOSP PERFORMED)  ETHANOL  URINE MICROSCOPIC-ADD ON    Imaging Review Ct Head Wo Contrast  06/11/2014   CLINICAL DATA:  Numbness in the right face and left arm.  EXAM: CT HEAD WITHOUT CONTRAST  TECHNIQUE: Contiguous axial images were obtained from the base of the skull through the vertex without intravenous contrast.  COMPARISON:  None.  FINDINGS: 9 mm diameter focal intraparenchymal hemorrhage in the right posterior pons and midbrain. This likely represents a focal hemorrhagic infarct. No mass effect or midline shift. Diffuse cerebral atrophy. Mild ventricular dilatation consistent with central atrophy. Low-attenuation changes in the deep white matter consistent small vessel ischemia. No abnormal extra-axial fluid collections. Gray-white matter junctions are distinct. Basal cisterns are not effaced. Calvarium is intact. Visualized paranasal sinuses and mastoid air cells are not opacified.  IMPRESSION: Focal acute intraparenchymal hemorrhage in the posterior pons and midbrain. This is likely represent focal hemorrhagic infarct. Chronic atrophy and small vessel ischemic changes.  These results were called by telephone at the time of interpretation on 06/11/2014 at 3:58 am to Dr. Loren RacerAVID Ragan Duhon , who verbally acknowledged these results.   Electronically Signed   By: Burman NievesWilliam  Stevens M.D.   On: 06/11/2014 04:00     EKG Interpretation   Date/Time:  Sunday June 11 2014 03:28:56 EDT Ventricular Rate:  88 PR Interval:  187 QRS Duration: 100 QT Interval:  369 QTC Calculation: 446 R Axis:   -6 Text Interpretation:  Sinus rhythm Probable left atrial enlargement RSR'  in V1 or  V2, right VCD or RVH Left ventricular hypertrophy ST depr,  consider ischemia, inferior leads ST elevation, consider anterior injury  Confirmed by Ranae PalmsYELVERTON  MD, Roan Sawchuk ([id number]) on 06/11/2014 4:26:34 AM      MDM   Final diagnoses:  ICH (intracerebral hemorrhage)    I personally performed the services described in this documentation, which was scribed in my presence. The recorded information has been reviewed and is accurate.  Patient with inconsistent neuro exam but complaining of numbness. This is new onset. Patient does have a history of hypertension and has not been taking his medication.  CT head with small appearing acute hemorrhagic stroke in the pons. Discuss with the neuro hospitalist will see the patient in the emergency department and admit. Patient given initial dose of IV labetalol with little effect. Will start on Cardene drip   Loren Raceravid Hermine Feria, MD 06/11/14 [phone]

## 2014-06-12 ENCOUNTER — Inpatient Hospital Stay (HOSPITAL_COMMUNITY): Payer: Non-veteran care

## 2014-06-12 ENCOUNTER — Encounter (HOSPITAL_COMMUNITY): Payer: Self-pay | Admitting: General Practice

## 2014-06-12 DIAGNOSIS — Z720 Tobacco use: Secondary | ICD-10-CM

## 2014-06-12 DIAGNOSIS — I610 Nontraumatic intracerebral hemorrhage in hemisphere, subcortical: Secondary | ICD-10-CM

## 2014-06-12 LAB — BASIC METABOLIC PANEL
ANION GAP: 4 — AB (ref 5–15)
BUN: 12 mg/dL (ref 6–23)
CALCIUM: 8.8 mg/dL (ref 8.4–10.5)
CHLORIDE: 105 mmol/L (ref 96–112)
CO2: 27 mmol/L (ref 19–32)
Creatinine, Ser: 1.24 mg/dL (ref 0.50–1.35)
GFR, EST AFRICAN AMERICAN: 78 mL/min — AB (ref >90)
GFR, EST NON AFRICAN AMERICAN: 67 mL/min — AB (ref >90)
Glucose, Bld: 110 mg/dL — ABNORMAL HIGH (ref 70–99)
Potassium: 3.6 mmol/L (ref 3.5–5.1)
SODIUM: 136 mmol/L (ref 135–145)

## 2014-06-12 LAB — LIPID PANEL
CHOLESTEROL: 189 mg/dL (ref 0–200)
HDL: 36 mg/dL — AB (ref >39)
LDL Cholesterol: 120 mg/dL — ABNORMAL HIGH (ref 0–99)
TRIGLYCERIDES: 165 mg/dL — AB (ref <150)
Total CHOL/HDL Ratio: 5.3 RATIO
VLDL: 33 mg/dL (ref 0–40)

## 2014-06-12 LAB — CBC
HCT: 45.9 % (ref 39.0–52.0)
Hemoglobin: 15.5 g/dL (ref 13.0–17.0)
MCH: 30.9 pg (ref 26.0–34.0)
MCHC: 33.8 g/dL (ref 30.0–36.0)
MCV: 91.6 fL (ref 78.0–100.0)
Platelets: 267 10*3/uL (ref 150–400)
RBC: 5.01 MIL/uL (ref 4.22–5.81)
RDW: 14.5 % (ref 11.5–15.5)
WBC: 5.8 10*3/uL (ref 4.0–10.5)

## 2014-06-12 LAB — TSH: TSH: 1.554 u[IU]/mL (ref 0.350–4.500)

## 2014-06-12 LAB — T4, FREE: FREE T4: 1.06 ng/dL (ref 0.80–1.80)

## 2014-06-12 LAB — VITAMIN B12: VITAMIN B 12: 411 pg/mL (ref 211–911)

## 2014-06-12 MED ORDER — AMLODIPINE BESYLATE 10 MG PO TABS
10.0000 mg | ORAL_TABLET | Freq: Every day | ORAL | Status: DC
  Administered 2014-06-12 – 2014-06-14 (×3): 10 mg via ORAL
  Filled 2014-06-12 (×3): qty 1

## 2014-06-12 MED ORDER — HEPARIN SODIUM (PORCINE) 5000 UNIT/ML IJ SOLN
5000.0000 [IU] | Freq: Three times a day (TID) | INTRAMUSCULAR | Status: DC
  Administered 2014-06-12 – 2014-06-14 (×6): 5000 [IU] via SUBCUTANEOUS
  Filled 2014-06-12 (×6): qty 1

## 2014-06-12 MED ORDER — LABETALOL HCL 5 MG/ML IV SOLN
10.0000 mg | INTRAVENOUS | Status: DC | PRN
  Administered 2014-06-13 – 2014-06-14 (×2): 20 mg via INTRAVENOUS
  Filled 2014-06-12 (×2): qty 4

## 2014-06-12 MED ORDER — ATORVASTATIN CALCIUM 10 MG PO TABS
20.0000 mg | ORAL_TABLET | Freq: Every day | ORAL | Status: DC
  Administered 2014-06-12 – 2014-06-13 (×2): 20 mg via ORAL
  Filled 2014-06-12 (×2): qty 2

## 2014-06-12 MED ORDER — PANTOPRAZOLE SODIUM 40 MG PO TBEC
40.0000 mg | DELAYED_RELEASE_TABLET | Freq: Every day | ORAL | Status: DC
  Administered 2014-06-12 – 2014-06-14 (×3): 40 mg via ORAL
  Filled 2014-06-12 (×3): qty 1

## 2014-06-12 MED ORDER — LISINOPRIL 10 MG PO TABS
10.0000 mg | ORAL_TABLET | Freq: Once | ORAL | Status: AC
  Administered 2014-06-12: 10 mg via ORAL
  Filled 2014-06-12 (×2): qty 1

## 2014-06-12 MED ORDER — LISINOPRIL 20 MG PO TABS
20.0000 mg | ORAL_TABLET | Freq: Every day | ORAL | Status: DC
  Administered 2014-06-13: 20 mg via ORAL
  Filled 2014-06-12: qty 1

## 2014-06-12 NOTE — Progress Notes (Signed)
  Echocardiogram 2D Echocardiogram has been performed.  Andre EvertsRix, Nykia Turko A 06/12/2014, 12:39 PM

## 2014-06-12 NOTE — Progress Notes (Signed)
Pt is admitted to 4N24 from . Admission vital signs are stable.

## 2014-06-12 NOTE — Progress Notes (Signed)
STROKE TEAM PROGRESS NOTE   SUBJECTIVE (INTERVAL HISTORY) No one is at the bedside.  Overall he feels his condition is rapidly improving. He still has double vision on right gaze, felt some tingling on the right face and left arm. Other than that, he feels great. Blood pressure around 160, off Cardene.   OBJECTIVE Temp:  [98 F (36.7 C)-98.7 F (37.1 C)] 98 F (36.7 C) (04/11 1446) Pulse Rate:  [62-127] 70 (04/11 1446) Cardiac Rhythm:  [-] Normal sinus rhythm (04/11 1436) Resp:  [15-28] 18 (04/11 1446) BP: (111-160)/(69-113) 147/95 mmHg (04/11 1446) SpO2:  [73 %-100 %] 100 % (04/11 1446)  No results for input(s): GLUCAP in the last 168 hours.  Recent Labs Lab 06/11/14 0341 06/12/14 0526  NA 137 136  K 3.4* 3.6  CL 101 105  CO2 27 27  GLUCOSE 109* 110*  BUN 11 12  CREATININE 1.25 1.24  CALCIUM 8.9 8.8    Recent Labs Lab 06/11/14 0341  AST 27  ALT 24  ALKPHOS 77  BILITOT 0.7  PROT 7.4  ALBUMIN 3.6    Recent Labs Lab 06/11/14 0341 06/12/14 0526  WBC 6.0 5.8  NEUTROABS 3.7  --   HGB 15.6 15.5  HCT 45.8 45.9  MCV 92.2 91.6  PLT 279 267    Recent Labs Lab 06/11/14 0341  TROPONINI <0.03    Recent Labs  06/11/14 0341  LABPROT 12.5  INR 0.92    Recent Labs  06/11/14 0410  COLORURINE YELLOW  LABSPEC 1.013  PHURINE 6.0  GLUCOSEU NEGATIVE  HGBUR TRACE*  BILIRUBINUR NEGATIVE  KETONESUR NEGATIVE  PROTEINUR 100*  UROBILINOGEN 0.2  NITRITE NEGATIVE  LEUKOCYTESUR NEGATIVE       Component Value Date/Time   CHOL 189 06/12/2014 0526   TRIG 165* 06/12/2014 0526   HDL 36* 06/12/2014 0526   CHOLHDL 5.3 06/12/2014 0526   VLDL 33 06/12/2014 0526   LDLCALC 120* 06/12/2014 0526   No results found for: HGBA1C    Component Value Date/Time   LABOPIA NONE DETECTED 06/11/2014 0410   COCAINSCRNUR NONE DETECTED 06/11/2014 0410   LABBENZ NONE DETECTED 06/11/2014 0410   AMPHETMU NONE DETECTED 06/11/2014 0410   THCU NONE DETECTED 06/11/2014 0410   LABBARB NONE DETECTED 06/11/2014 0410     Recent Labs Lab 06/11/14 0341  ETH <5    I have personally reviewed the radiological images below and agree with the radiology interpretations.  Ct Head Wo Contrast  06/12/2014   IMPRESSION: Evolving 1 cm RIGHT posterior pontine intraparenchymal hematoma.  Moderate to severe white matter changes suggest chronic small vessel ischemic disease, advanced for age. Remote lacunar infarcts better seen on prior MRI.     06/11/2014   IMPRESSION: Focal acute intraparenchymal hemorrhage in the posterior pons and midbrain. This is likely represent focal hemorrhagic infarct. Chronic atrophy and small vessel ischemic changes.   Mri and Mra Head Wo Contrast  06/11/2014   IMPRESSION: MRI HEAD IMPRESSION:  1. 10 x 8 mm acute parenchymal hemorrhage within the dorsal right midbrain/pons. Underlying hypertensive bleed is suspected. 2. Additional multiple remote lacunar infarcts involving the bilateral basal ganglia and pons. 3. Moderate chronic small vessel ischemic disease.  MRA HEAD IMPRESSION:  Unremarkable MRA of the intracranial circulation without significant stenosis or arterial occlusion.     Carotid Doppler  Bilateral: 1-39% ICA stenosis. Vertebral artery flow is antegrade  2D Echocardiogram  - Left ventricle: The cavity size was normal. There was mild concentric hypertrophy. Systolic function was mildly  to moderately reduced. The estimated ejection fraction was in the range of 40% to 45%. Diffuse hypokinesis. Doppler parameters are consistent with abnormal left ventricular relaxation (grade 1 diastolic dysfunction). No evidence of thrombus.  PHYSICAL EXAM  Temp:  [98 F (36.7 C)-98.7 F (37.1 C)] 98 F (36.7 C) (04/11 1446) Pulse Rate:  [62-127] 70 (04/11 1446) Resp:  [15-28] 18 (04/11 1446) BP: (111-160)/(69-113) 147/95 mmHg (04/11 1446) SpO2:  [73 %-100 %] 100 % (04/11 1446)  General - Well nourished, well developed, in no apparent  distress.  Ophthalmologic - Sharp disc margins OU.  Cardiovascular - Regular rate and rhythm with no murmur.  Neck - supple, no carotid bruits  Mental Status -  Level of arousal and orientation to time, place, and person were intact. Language including expression, naming, repetition, comprehension was assessed and found intact. Fund of Knowledge was assessed and was intact.  Cranial Nerves II - XII - II - Visual field intact OU. III, IV, VI - Extraocular movements showed right partial sixth nerve palsy, diplopia on right horizontal gaze. V - Facial sensation intact bilaterally. VII - Facial movement intact bilaterally. VIII - Hearing & vestibular intact bilaterally. X - Palate elevates symmetrically. XI - Chin turning & shoulder shrug intact bilaterally. XII - Tongue protrusion intact.  Motor Strength - The patient's strength was normal in all extremities and pronator drift was absent.  Bulk was normal and fasciculations were absent.   Motor Tone - Muscle tone was assessed at the neck and appendages and was normal.  Reflexes - The patient's reflexes were symmetrical in all extremities and he had no pathological reflexes.  Sensory - Light touch, temperature/pinprick were assessed and were symmetrical.    Coordination - The patient had normal movements in the hands and feet with no ataxia or dysmetria.  Tremor was absent.  Gait and Station - deferred due to diplopia.   ASSESSMENT/PLAN [name] is a 49 y.o. male with history of uncontrolled hypertension admitted for pontine hemorrhage. Symptoms improved.    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  MRI  pontine hemorrhage  MRA  unremarkable  Carotid Doppler  unremarkable  2D Echo  EF 40-45%  LDL 120, not at goal  HgbA1c pending  Heparin subcutaneous for VTE prophylaxis  Diet Heart Room service appropriate?: Yes; Fluid consistency:: Thin   no antithrombotic prior to admission, now  on no antithrombotic  Ongoing aggressive stroke risk factor management  Therapy recommendations:  Pending  Disposition:  Pending  Hypertension  Home meds:   Not clear, [facility] BP goal < 160 Currently on amlodipine and lisinopril  Stable  Patient counseled to be compliant with his blood pressure medications  Hyperlipidemia  Home meds:  None   LDL 120, goal < 70  Add Lipitor 20  Continue statin at discharge  Tobacco abuse  Current smoker  Smoking cessation counseling provided  Pt is willing to quit  Other Stroke Risk Factors  ETOH use  Obesity, Body mass index is 33 kg/(m^2).   Other Active Problems  Elevated triglyceride  Other Pertinent History    Hospital day # 1   Marvel Plan, MD PhD Stroke Neurology 06/12/2014 4:57 PM    To contact Stroke Continuity provider, please refer to [email or web address]. After hours, contact General Neurology

## 2014-06-12 NOTE — Evaluation (Signed)
Physical Therapy Evaluation Patient Details Name: Andre [name] MRN: [id number] DOB: 05/26/1965 Today's Date: 06/12/2014   History of Present Illness  Pt is a 49 y.o. male hx of HTN presenting to the ED for right sided numbness involving face and arm. States he has pain on the right side of his face. Denies any weakness. Denies any speech issues. Notes difficulty with moving his eyes to the right. No prior CVA or TIA. Notes history of HTN but is non-compliant with medications. CT revealed a 9mm ICH in the posterior pons and midbrain.   Clinical Impression  Pt admitted with above diagnosis. Pt currently with functional limitations due to the deficits listed below (see PT Problem List). At the time of PT eval pt was able to perform transfers and ambulation with supervision to min assist required for balance. Pt reports that he has been having double vision since original symptoms began, and therapist noted pt trying to watch TV with L eye closed. OT evaluation would be beneficial to assess for further vision needs. Pt will benefit from skilled PT to increase their independence and safety with mobility to allow discharge to the venue listed below.       Follow Up Recommendations Outpatient PT (and/or OPOT for vision changes and balance)    Equipment Recommendations  Cane    Recommendations for Other Services       Precautions / Restrictions Precautions Precautions: Fall Restrictions Weight Bearing Restrictions: No      Mobility  Bed Mobility               General bed mobility comments: Pt sitting up in recliner chair upon PT arrival.   Transfers Overall transfer level: Needs assistance Equipment used: None Transfers: Sit to/from Stand Sit to Stand: Supervision         General transfer comment: Pt able to power-up to full standing position with no physical assistance required. Pt slightly unsteady at first stand, however was able to recover independently. Pt reports  dizziness with positional changes.   Ambulation/Gait Ambulation/Gait assistance: Min assist Ambulation Distance (Feet): 125 Feet Assistive device: None Gait Pattern/deviations: Step-through pattern;Decreased stride length;Trunk flexed Gait velocity: Decreased Gait velocity interpretation: Below normal speed for age/gender General Gait Details: Pt was able to ambulate without UE support. Occasional min assist required for noted balance deficits.   Stairs            Wheelchair Mobility    Modified Rankin (Stroke Patients Only)       Balance Overall balance assessment: Needs assistance Sitting-balance support: Feet supported;No upper extremity supported Sitting balance-Leahy Scale: Fair     Standing balance support: No upper extremity supported Standing balance-Leahy Scale: Poor Standing balance comment: Occasional assist required during dynamic activity                             Pertinent Vitals/Pain Pain Assessment: No/denies pain    Home Living Family/patient expects to be discharged to:: Private residence Living Arrangements: Non-relatives/Friends Available Help at Discharge: Family;Friend(s);Available 24 hours/day (for a short time) Type of Home: Apartment Home Access: Stairs to enter   Entrance Stairs-Number of Steps: Flight Home Layout: One level Home Equipment: None      Prior Function Level of Independence: Independent         Comments: Pt currently working full time.      Hand Dominance   Dominant Hand: Right    Extremity/Trunk Assessment   Upper  Extremity Assessment: Defer to OT evaluation;LUE deficits/detail       LUE Deficits / Details: When tested BUE's together, noted strength difference in LUE vs the right. When tested separately however, LUE almost equal to the right in strength with L side being only slightly weaker.    Lower Extremity Assessment: Overall WFL for tasks assessed      Cervical / Trunk Assessment:  Normal  Communication   Communication: No difficulties  Cognition Arousal/Alertness: Awake/alert Behavior During Therapy: WFL for tasks assessed/performed Overall Cognitive Status: Impaired/Different from baseline Area of Impairment: Attention;Safety/judgement;Problem solving   Current Attention Level: Alternating     Safety/Judgement: Decreased awareness of safety   Problem Solving: Slow processing;Difficulty sequencing;Requires verbal cues      General Comments      Exercises        Assessment/Plan    PT Assessment Patient needs continued PT services  PT Diagnosis Difficulty walking;Abnormality of gait   PT Problem List Decreased strength;Decreased range of motion;Decreased activity tolerance;Decreased balance;Decreased mobility;Decreased knowledge of use of DME;Decreased safety awareness;Decreased knowledge of precautions  PT Treatment Interventions DME instruction;Gait training;Stair training;Functional mobility training;Therapeutic activities;Therapeutic exercise;Neuromuscular re-education;Patient/family education   PT Goals (Current goals can be found in the Care Plan section) Acute Rehab PT Goals Patient Stated Goal: Back to work as soon as possible PT Goal Formulation: With patient Time For Goal Achievement: 06/19/14 Potential to Achieve Goals: Good    Frequency Min 4X/week   Barriers to discharge        Co-evaluation               End of Session Equipment Utilized During Treatment: Gait belt Activity Tolerance: Patient tolerated treatment well Patient left: in chair Nurse Communication: Mobility status         Time: 1610-9604 PT Time Calculation (min) (ACUTE ONLY): 24 min   Charges:   PT Evaluation $Initial PT Evaluation Tier I: 1 Procedure PT Treatments $Gait Training: 8-22 mins   PT G Codes:        Conni Slipper 07/06/14, 12:32 PM   Conni Slipper, PT, DPT Acute Rehabilitation Services Pager: [phone]

## 2014-06-12 NOTE — Progress Notes (Signed)
UR Completed.  [phone]  

## 2014-06-12 NOTE — Progress Notes (Signed)
*  PRELIMINARY RESULTS* Vascular Ultrasound Carotid Duplex (Doppler) has been completed.  Preliminary findings: Bilateral:  1-39% ICA stenosis.  Vertebral artery flow is antegrade.    Farrel DemarkJill Eunice, RDMS, RVT   06/12/2014, 2:06 PM

## 2014-06-13 DIAGNOSIS — H532 Diplopia: Secondary | ICD-10-CM

## 2014-06-13 DIAGNOSIS — E785 Hyperlipidemia, unspecified: Secondary | ICD-10-CM

## 2014-06-13 DIAGNOSIS — I10 Essential (primary) hypertension: Secondary | ICD-10-CM

## 2014-06-13 LAB — CBC
HCT: 44.8 % (ref 39.0–52.0)
HEMOGLOBIN: 15.3 g/dL (ref 13.0–17.0)
MCH: 31.3 pg (ref 26.0–34.0)
MCHC: 34.2 g/dL (ref 30.0–36.0)
MCV: 91.6 fL (ref 78.0–100.0)
Platelets: 262 10*3/uL (ref 150–400)
RBC: 4.89 MIL/uL (ref 4.22–5.81)
RDW: 14.2 % (ref 11.5–15.5)
WBC: 6.4 10*3/uL (ref 4.0–10.5)

## 2014-06-13 LAB — BASIC METABOLIC PANEL
Anion gap: 7 (ref 5–15)
BUN: 11 mg/dL (ref 6–23)
CALCIUM: 9 mg/dL (ref 8.4–10.5)
CO2: 30 mmol/L (ref 19–32)
CREATININE: 1.11 mg/dL (ref 0.50–1.35)
Chloride: 100 mmol/L (ref 96–112)
GFR calc Af Amer: 89 mL/min — ABNORMAL LOW (ref >90)
GFR, EST NON AFRICAN AMERICAN: 77 mL/min — AB (ref >90)
GLUCOSE: 103 mg/dL — AB (ref 70–99)
POTASSIUM: 3.5 mmol/L (ref 3.5–5.1)
Sodium: 137 mmol/L (ref 135–145)

## 2014-06-13 LAB — HEMOGLOBIN A1C
Hgb A1c MFr Bld: 5.6 % (ref 4.8–5.6)
Mean Plasma Glucose: 114 mg/dL

## 2014-06-13 MED ORDER — METOPROLOL TARTRATE 25 MG PO TABS
25.0000 mg | ORAL_TABLET | Freq: Two times a day (BID) | ORAL | Status: DC
  Administered 2014-06-13: 25 mg via ORAL
  Filled 2014-06-13: qty 1

## 2014-06-13 MED ORDER — LISINOPRIL 20 MG PO TABS
20.0000 mg | ORAL_TABLET | Freq: Two times a day (BID) | ORAL | Status: DC
  Administered 2014-06-13 – 2014-06-14 (×2): 20 mg via ORAL
  Filled 2014-06-13 (×2): qty 1

## 2014-06-13 MED ORDER — METOPROLOL TARTRATE 12.5 MG HALF TABLET
12.5000 mg | ORAL_TABLET | Freq: Two times a day (BID) | ORAL | Status: DC
  Administered 2014-06-13: 12.5 mg via ORAL
  Filled 2014-06-13: qty 1

## 2014-06-13 NOTE — Progress Notes (Signed)
Physical Therapy Treatment Patient Details Name: Andre [name] MRN: [id number] DOB: 1966-01-04 Today's Date: 06/13/2014    History of Present Illness Pt is a 49 y.o. male hx of HTN presenting to the ED for right sided numbness involving face and arm. States he has pain on the right side of his face. Denies any weakness. Denies any speech issues. Notes difficulty with moving his eyes to the right. No prior CVA or TIA. Notes history of HTN but is non-compliant with medications. CT revealed a 9mm ICH in the posterior pons and midbrain.     PT Comments    Pt con't to have double vision however when L eye covered pt reports no more double vision. Pt con't to present with balance deficits as indicated by socre of 16 on DGI. Pt con't to progress towards goals. Con't to benefit from OP PT to address higher level balance to achieve maximal functional return.   Follow Up Recommendations  Outpatient PT;Supervision - Intermittent     Equipment Recommendations  None recommended by PT    Recommendations for Other Services       Precautions / Restrictions Precautions Precautions: Fall Precaution Comments: blurred/double vision Required Braces or Orthoses: Other Brace/Splint (pt reports MD ordering an eye patch) Restrictions Weight Bearing Restrictions: No    Mobility  Bed Mobility Overal bed mobility: Modified Independent                Transfers Overall transfer level: Needs assistance Equipment used: None Transfers: Sit to/from Stand Sit to Stand: Supervision         General transfer comment: supervision due to impulsivity and quickness, mildly unsteady initiatly  Ambulation/Gait Ambulation/Gait assistance: Min guard Ambulation Distance (Feet): 550 Feet Assistive device: None Gait Pattern/deviations: Step-through pattern     General Gait Details: pt with temporary eye patch on to simulate eye patch for home use. Pt able to amb without LOB with supervision but when  challenged with pertebations min guard required for max pertebation however pt with appropriate righting reaction.pt with narrow base of support with occasional cross over, pt reports this to be normal   Stairs Stairs: Yes Stairs assistance: Min guard Stair Management: One rail Left;Alternating pattern Number of Stairs: 12 General stair comments: v/c's to slow down  Wheelchair Mobility    Modified Rankin (Stroke Patients Only)       Balance Overall balance assessment: Needs assistance                           High level balance activites: Braiding;Backward walking (tandem ambulation) High Level Balance Comments: pt required minA to maintain balance during tandem ambulation. pt was able to braid and amb backward with min guard but had to use UEs in extension to guard self and maintain balance    Cognition Arousal/Alertness: Awake/alert Behavior During Therapy: WFL for tasks assessed/performed Overall Cognitive Status: Impaired/Different from baseline Area of Impairment: Safety/judgement         Safety/Judgement: Decreased awareness of deficits (feels like he can return to work despite vision alterations)     General Comments: pt much improved from yesterday, was able to stay focused on task and follow commands without difficutly    Exercises      General Comments        Pertinent Vitals/Pain Pain Assessment: No/denies pain    Home Living  Prior Function            PT Goals (current goals can now be found in the care plan section) Acute Rehab PT Goals Patient Stated Goal: back to work Progress towards PT goals: Progressing toward goals    Frequency  Min 3X/week    PT Plan Current plan remains appropriate;Discharge plan needs to be updated    Co-evaluation             End of Session Equipment Utilized During Treatment: Gait belt Activity Tolerance: Patient tolerated treatment well Patient left: in  bed;with bed alarm set     Time: [phone] PT Time Calculation (min) (ACUTE ONLY): 28 min  Charges:  $Gait Training: 8-22 mins $Neuromuscular Re-education: 8-22 mins                    G Codes:      Marcene Brawn 06/13/2014, 9:48 AM   Lewis Shock, PT, DPT Pager #: [phone] Office #: [phone]

## 2014-06-13 NOTE — Progress Notes (Signed)
Eye patch given to pt, explained to rotate every 2- 4 while awake. oob without assist today, observation. Steady no episodes of imbalance. Alert oriented.

## 2014-06-13 NOTE — Progress Notes (Signed)
Patients BP at 0152 was 176/109 gave 20 mg of labetalol will recheck blood presure in 30 min.

## 2014-06-13 NOTE — Evaluation (Signed)
Speech Language Pathology Evaluation Patient Details Name: Andre [name] MRN: [id number] DOB: 1966/01/27 Today's Date: 06/13/2014 Time: [id number] SLP Time Calculation (min) (ACUTE ONLY): 18 min  Problem List:  Patient Active Problem List   Diagnosis Date Noted  . ICH (intracerebral hemorrhage) 06/11/2014   Past Medical History:  Past Medical History  Diagnosis Date  . Hypertension    Past Surgical History:  Past Surgical History  Procedure Laterality Date  . Appendectomy    . Abdominal surgery     HPI:  Pt is a 49 y.o. male hx of HTN presenting to the ED for right sided numbness involving face and arm. States he has pain on the right side of his face. Denies any weakness. Denies any speech issues. Notes difficulty with moving his eyes to the right. No prior CVA or TIA. Notes history of HTN but is non-compliant with medications. CT revealed a 9mm ICH in the posterior pons and midbrain.    Assessment / Plan / Recommendation Clinical Impression  Pt appears to be at his cognitive-linguistic baseline with mildly complex cognitive skills within gross functional limits. He is without subjective c/o difficulty. No acute SLP needs identified.    SLP Assessment  Patient does not need any further Speech Lanaguage Pathology Services    Follow Up Recommendations  None    Pertinent Vitals/Pain Pain Assessment: No/denies pain   SLP Goals  Patient/Family Stated Goal: wants to go home  SLP Evaluation Prior Functioning  Cognitive/Linguistic Baseline: Within functional limits Type of Home: Apartment  Lives With: Other (Comment) (roommate) Available Help at Discharge: Family;Friend(s);Available 24 hours/day Vocation: Full time employment   Cognition  Overall Cognitive Status: Within Functional Limits for tasks assessed Orientation Level: Oriented X4    Comprehension  Auditory Comprehension Overall Auditory Comprehension: Appears within functional limits for tasks  assessed Visual Recognition/Discrimination Discrimination: Within Function Limits Reading Comprehension Reading Status: Within funtional limits (pt covers left eye to compensate for double vision)    Expression Expression Primary Mode of Expression: Verbal Verbal Expression Overall Verbal Expression: Appears within functional limits for tasks assessed Written Expression Written Expression: Not tested   Oral / Motor Motor Speech Overall Motor Speech: Appears within functional limits for tasks assessed    Andre [name], M.A. CCC-SLP [phone]  Andre [name], Andre [name] 06/13/2014, 9:49 AM

## 2014-06-13 NOTE — Evaluation (Signed)
Occupational Therapy Evaluation Patient Details Name: Andre [name] MRN: [id number] DOB: [birth year] Today's Date: 06/13/2014    History of Present Illness Pt is a 49 y.o. male hx of HTN presenting to the ED for right sided numbness involving face and arm. States he has pain on the right side of his face. Denies any weakness. Denies any speech issues. Notes difficulty with moving his eyes to the right. No prior CVA or TIA. Notes history of HTN but is non-compliant with medications. CT revealed a 9mm ICH in the posterior pons and midbrain.    Clinical Impression   Pt admitted with the above diagnoses and presents with below problem list. Pt will benefit from continued OT to address the below listed deficits and maximize independence with BADLs prior to venue below. 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 Currently pt is at min guard level with LB ADLs and functional mobility due to balance, diplopia.     Follow Up Recommendations  Outpatient OT;Supervision/Assistance - 24 hour;Other (comment) (optometrist)    Equipment Recommendations  None recommended by OT    Recommendations for Other Services       Precautions / Restrictions Precautions Precautions: Fall Precaution Comments: blurred/double vision Required Braces or Orthoses: Other Brace/Splint (pt reports MD ordering an eye patch) Restrictions Weight Bearing Restrictions: No      Mobility Bed Mobility Overal bed mobility: Modified Independent                Transfers Overall transfer level: Needs assistance Equipment used: None Transfers: Sit to/from Stand Sit to Stand: Supervision         General transfer comment: increased speed/impulsivity, mild unsteadiness    Balance Overall balance assessment: Needs assistance Sitting-balance support: Bilateral upper extremity supported;Feet supported Sitting balance-Leahy Scale: Good     Standing balance support: No upper  extremity supported;During functional activity Standing balance-Leahy Scale: Fair Standing balance comment: uses external support at times to steady self during dynamic activities/functinoal mobility                  ADL Overall ADL's : Needs assistance/impaired Eating/Feeding: Sitting;Supervision/ safety   Grooming: Sitting;Standing;Supervision/safety   Upper Body Bathing: Set up;Sitting   Lower Body Bathing: Min guard;Sit to/from stand   Upper Body Dressing : Set up;Sitting   Lower Body Dressing: Min guard;Sit to/from stand   Toilet Transfer: Min guard;Ambulation;Comfort height toilet;Grab bars   Toileting- Clothing Manipulation and Hygiene: Min guard;Sit to/from stand   Tub/ Shower Transfer: Min guard;Ambulation;Shower seat   Functional mobility during ADLs: Min guard General ADL Comments: Min guard for LB ADLs, tranfers and functional mobility due to balance. ADL education provided. Recommended pt not drive/work for the time being. Discussed outpatient OT to assist with timeline for returning to work. Recommended pt f/u with optometrist.     Vision Vision Assessment?: Yes Eye Alignment: Within Functional Limits Ocular Range of Motion: Within Functional Limits Tracking/Visual Pursuits: Able to track stimulus in all quads without difficulty Saccades: Decreased speed of saccadic movement;Other (comment) (to the right) Visual Fields: No apparent deficits;Other (comment) (did bump into obstacles on left side during ambulation) Diplopia Assessment: Disappears with one eye closed;Objects split side to side;Present in far gaze;Other (comment) (disappears with left eye occluded)   Perception     Praxis      Pertinent Vitals/Pain Pain Assessment: No/denies pain     Hand Dominance Right   Extremity/Trunk Assessment Upper Extremity Assessment Upper Extremity Assessment: [facility]  for tasks assessed   Lower Extremity Assessment Lower Extremity Assessment: Defer to  PT evaluation   Cervical / Trunk Assessment Cervical / Trunk Assessment: Normal   Communication Communication Communication: No difficulties   Cognition Arousal/Alertness: Awake/alert Behavior During Therapy: WFL for tasks assessed/performed Overall Cognitive Status: Impaired/Different from baseline Area of Impairment: Safety/judgement         Safety/Judgement: Decreased awareness of deficits     General Comments: pt noted to bump into obstacles on left side and with some unsteadiness but required verbal cues to slow down walking speed.   General Comments       Exercises       Shoulder Instructions      Home Living Family/patient expects to be discharged to:: Private residence Living Arrangements: Non-relatives/Friends Available Help at Discharge: Family;Friend(s);Available 24 hours/day Type of Home: Apartment Home Access: Stairs to enter Entrance Stairs-Number of Steps: Flight   Home Layout: One level     Bathroom Shower/Tub: Chief Strategy Officer: Standard     Home Equipment: None   Additional Comments: pt declined shower seat recommendation, educated on using a lawn chair with towel underneath for shower. Also educated on using wall/sink to steady self during transfers  Lives With: Other (Comment) (roomate )    Prior Functioning/Environment Level of Independence: Independent        Comments: Pt currently working full time in a machine shop.    OT Diagnosis: Cognitive deficits;Disturbance of vision   OT Problem List: Impaired balance (sitting and/or standing);Impaired vision/perception;Decreased cognition;Decreased safety awareness;Decreased knowledge of precautions   OT Treatment/Interventions: Self-care/ADL training;Therapeutic exercise;Neuromuscular education;DME and/or AE instruction;Therapeutic activities;Cognitive remediation/compensation;Visual/perceptual remediation/compensation;Patient/family education;Balance training    OT  Goals(Current goals can be found in the care plan section) Acute Rehab OT Goals Patient Stated Goal: back to work OT Goal Formulation: With patient Time For Goal Achievement: 06/27/14 Potential to Achieve Goals: Good ADL Goals Pt Will Perform Lower Body Bathing: with modified independence;with adaptive equipment;sit to/from stand Pt Will Perform Lower Body Dressing: with modified independence;with adaptive equipment;sit to/from stand Pt Will Perform Tub/Shower Transfer: with modified independence;ambulating;shower seat  OT Frequency: Min 2X/week   Barriers to D/C:            Co-evaluation              End of Session    Activity Tolerance: Patient tolerated treatment well Patient left: in bed;with call bell/phone within reach;with bed alarm set   Time: 1478-2956 OT Time Calculation (min): 21 min Charges:  OT General Charges $OT Visit: 1 Procedure OT Evaluation $Initial OT Evaluation Tier I: 1 Procedure G-Codes:    Pilar Grammes Jul 11, 2014, 11:04 AM

## 2014-06-13 NOTE — Progress Notes (Signed)
STROKE TEAM PROGRESS NOTE   SUBJECTIVE (INTERVAL HISTORY) States he walked with PT with a washcloth over one eye. He has not received a patch.    OBJECTIVE Temp:  [98 F (36.7 C)-98.4 F (36.9 C)] 98.1 F (36.7 C) (04/12 0554) Pulse Rate:  [63-127] 71 (04/12 0554) Cardiac Rhythm:  [-] Normal sinus rhythm (04/12 0800) Resp:  [15-25] 16 (04/12 0554) BP: (144-176)/(94-113) 170/104 mmHg (04/12 0554) SpO2:  [73 %-100 %] 100 % (04/12 0554)  No results for input(s): GLUCAP in the last 168 hours.  Recent Labs Lab 06/11/14 0341 06/12/14 0526 06/13/14 0716  NA 137 136 137  K 3.4* 3.6 3.5  CL 101 105 100  CO2 GLUCOSE 109* 110* 103*  BUN CREATININE 1.25 1.24 1.11  CALCIUM 8.9 8.8 9.0    Recent Labs Lab 06/11/14 0341  AST 27  ALT 24  ALKPHOS 77  BILITOT 0.7  PROT 7.4  ALBUMIN 3.6    Recent Labs Lab 06/11/14 0341 06/12/14 0526 06/13/14 0716  WBC 6.0 5.8 6.4  NEUTROABS 3.7  --   --   HGB 15.6 15.5 15.3  HCT 45.8 45.9 44.8  MCV 92.2 91.6 91.6  PLT 279 267 262    Recent Labs Lab 06/11/14 0341  TROPONINI <0.03    Recent Labs  06/11/14 0341  LABPROT 12.5  INR 0.92    Recent Labs  06/11/14 0410  COLORURINE YELLOW  LABSPEC 1.013  PHURINE 6.0  GLUCOSEU NEGATIVE  HGBUR TRACE*  BILIRUBINUR NEGATIVE  KETONESUR NEGATIVE  PROTEINUR 100*  UROBILINOGEN 0.2  NITRITE NEGATIVE  LEUKOCYTESUR NEGATIVE       Component Value Date/Time   CHOL 189 06/12/2014 0526   TRIG 165* 06/12/2014 0526   HDL 36* 06/12/2014 0526   CHOLHDL 5.3 06/12/2014 0526   VLDL 33 06/12/2014 0526   LDLCALC 120* 06/12/2014 0526   Lab Results  Component Value Date   HGBA1C 5.6 06/12/2014      Component Value Date/Time   LABOPIA NONE DETECTED 06/11/2014 0410   COCAINSCRNUR NONE DETECTED 06/11/2014 0410   LABBENZ NONE DETECTED 06/11/2014 0410   AMPHETMU NONE DETECTED 06/11/2014 0410   THCU NONE DETECTED 06/11/2014 0410   LABBARB NONE DETECTED 06/11/2014 0410      Recent Labs Lab 06/11/14 0341  ETH <5    Ct Head Wo Contrast 06/12/2014   IMPRESSION: Evolving 1 cm RIGHT posterior pontine intraparenchymal hematoma.  Moderate to severe white matter changes suggest chronic small vessel ischemic disease, advanced for age. Remote lacunar infarcts better seen on prior MRI.     06/11/2014   IMPRESSION: Focal acute intraparenchymal hemorrhage in the posterior pons and midbrain. This is likely represent focal hemorrhagic infarct. Chronic atrophy and small vessel ischemic changes.   Mri and Mra Head Wo Contrast 06/11/2014   IMPRESSION: MRI HEAD IMPRESSION:  1. 10 x 8 mm acute parenchymal hemorrhage within the dorsal right midbrain/pons. Underlying hypertensive bleed is suspected. 2. Additional multiple remote lacunar infarcts involving the bilateral basal ganglia and pons. 3. Moderate chronic small vessel ischemic disease.  MRA HEAD IMPRESSION:  Unremarkable MRA of the intracranial circulation without significant stenosis or arterial occlusion.     Carotid Doppler  Bilateral: 1-39% ICA stenosis. Vertebral artery flow is antegrade  2D Echocardiogram  - Left ventricle: The cavity size was normal. There was mild concentric hypertrophy. Systolic function was mildly to moderately reduced. The estimated ejection fraction was in therange of 40% to 45%. Diffuse  hypokinesis. Doppler parameters areconsistent with abnormal left ventricular relaxation (grade 1diastolic dysfunction). No evidence of thrombus.   PHYSICAL EXAM  Temp:  [98 F (36.7 C)-98.4 F (36.9 C)] 98.1 F (36.7 C) (04/12 0554) Pulse Rate:  [63-127] 71 (04/12 0554) Resp:  [15-25] 16 (04/12 0554) BP: (144-176)/(94-113) 170/104 mmHg (04/12 0554) SpO2:  [73 %-100 %] 100 % (04/12 0554)  General - Well nourished, well developed, in no apparent distress.  Ophthalmologic - Sharp disc margins OU.  Cardiovascular - Regular rate and rhythm with no murmur.  Neck - supple, no carotid bruits  Mental  Status -  Level of arousal and orientation to time, place, and person were intact. Language including expression, naming, repetition, comprehension was assessed and found intact. Fund of Knowledge was assessed and was intact.  Cranial Nerves II - XII - II - Visual field intact OU. III, IV, VI - Extraocular movements showed right partial sixth nerve palsy, diplopia on right horizontal gaze, improved from yesterday. V - Facial sensation intact bilaterally. VII - Facial movement intact bilaterally. VIII - Hearing & vestibular intact bilaterally. X - Palate elevates symmetrically. XI - Chin turning & shoulder shrug intact bilaterally. XII - Tongue protrusion intact.  Motor Strength - The patient's strength was normal in all extremities and pronator drift was absent.  Bulk was normal and fasciculations were absent.   Motor Tone - Muscle tone was assessed at the neck and appendages and was normal.  Reflexes - The patient's reflexes were symmetrical in all extremities and he had no pathological reflexes.  Sensory - Light touch, temperature/pinprick were assessed and were symmetrical.    Coordination - The patient had normal movements in the hands and feet with no ataxia or dysmetria.  Tremor was absent.  Gait and Station - deferred due to diplopia.   ASSESSMENT/PLAN Andre [name] is a 49 y.o. male with history of uncontrolled hypertension admitted for pontine hemorrhage.   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   BP 176/109 during the night, received 1 dose of Labetalol 20 mg past 24h  Still slightly elevated 150-170s. Will increase lisinopril to 20 bid and add metoprolol 12.5 mg bid  Patient counseled to be compliant with his blood pressure medications  Will check renal artery duplex and malignant hypertension labs  Hyperlipidemia  Home meds:  None   LDL 120, goal < 70  Add Lipitor 20  Continue statin at discharge  Tobacco abuse  Current smoker  Smoking cessation counseling provided  Pt is willing to quit  Other Stroke Risk Factors  ETOH use  Other Active Problems  Elevated triglyceride  Hospital day # 2  Rhoderick MoodyBIBY,SHARON  [facility] See Amion for Pager information 06/13/2014 11:26 AM   I, the attending vascular neurologist, have personally obtained a history, examined the patient, evaluated laboratory data, individually viewed imaging studies and  agree with radiology interpretations. Together with the NP/PA, we formulated the assessment and plan of care which reflects our mutual decision.  I have made any additions or clarifications directly to the above note and agree with the findings and plan as currently documented.   49 year old male with history of hypertension was admitted for small pontine hemorrhage. Patient has been doing well, except diplopia which is improving. However blood pressure is still high, continue amlodipine, increasing lisinopril dose and add new metoprolol. Check renal artery duplex and leg and hypertension labs.  Marvel Plan, MD PhD Stroke Neurology 06/13/2014 4:00 PM     To contact Stroke Continuity provider, please refer to [email or web address]. After hours, contact General Neurology

## 2014-06-14 ENCOUNTER — Other Ambulatory Visit: Payer: Self-pay | Admitting: *Deleted

## 2014-06-14 DIAGNOSIS — Q6100 Congenital renal cyst, unspecified: Secondary | ICD-10-CM

## 2014-06-14 DIAGNOSIS — I714 Abdominal aortic aneurysm, without rupture, unspecified: Secondary | ICD-10-CM

## 2014-06-14 DIAGNOSIS — I10 Essential (primary) hypertension: Secondary | ICD-10-CM

## 2014-06-14 MED ORDER — METOPROLOL TARTRATE 50 MG PO TABS: 50.0000 mg | ORAL_TABLET | Freq: Two times a day (BID) | ORAL | Status: DC

## 2014-06-14 MED ORDER — ATORVASTATIN CALCIUM 20 MG PO TABS: 20.0000 mg | ORAL_TABLET | Freq: Every day | ORAL | Status: AC

## 2014-06-14 MED ORDER — METOPROLOL TARTRATE 50 MG PO TABS
50.0000 mg | ORAL_TABLET | Freq: Two times a day (BID) | ORAL | Status: DC
  Administered 2014-06-14: 50 mg via ORAL
  Filled 2014-06-14: qty 1

## 2014-06-14 MED ORDER — LISINOPRIL 20 MG PO TABS: 20.0000 mg | ORAL_TABLET | Freq: Two times a day (BID) | ORAL | Status: AC

## 2014-06-14 MED ORDER — AMLODIPINE BESYLATE 10 MG PO TABS: 10.0000 mg | ORAL_TABLET | Freq: Every day | ORAL | Status: AC

## 2014-06-14 NOTE — Progress Notes (Signed)
Occupational Therapy Treatment Patient Details Name: Andre [name] MRN: [id number] DOB: 09/12/1965 Today's Date: 06/14/2014    History of present illness Pt is a 49 y.o. male hx of HTN presenting to the ED for right sided numbness involving face and arm. States he has pain on the right side of his face. Denies any weakness. Denies any speech issues. Notes difficulty with moving his eyes to the right. No prior CVA or TIA. Notes history of HTN but is non-compliant with medications. CT revealed a 9mm ICH in the posterior pons and midbrain.    OT comments  Pt progressing towards acute OT goals. Focus of session was oculomotor exercises to address diplopia and working on dynamic balance. Pt educated on eye patch use and safety with home setup. Pt completed ADLs as detailed below.   Follow Up Recommendations  Outpatient OT;Supervision/Assistance - 24 hour;Other (comment) ([facility])    Equipment Recommendations  None recommended by OT    Recommendations for Other Services      Precautions / Restrictions Precautions Precautions: Fall Precaution Comments: blurred/double vision Required Braces or Orthoses: Other Brace/Splint Other Brace/Splint: eye patch, alternate eye occulded every 2 hours Restrictions Weight Bearing Restrictions: No       Mobility Bed Mobility               General bed mobility comments: pt sitting EOB at start and end of session.  Transfers Overall transfer level: Needs assistance Equipment used: None Transfers: Sit to/from Stand Sit to Stand: Supervision         General transfer comment: increased speed, cueing to slow down. Mild unsteadiness still noted in static standing position.    Balance Overall balance assessment: Needs assistance Sitting-balance support: No upper extremity supported;Feet supported Sitting balance-Leahy Scale: Good     Standing balance support: No upper extremity supported;During functional activity Standing  balance-Leahy Scale: Fair Standing balance comment: Fair standing balance. Unsteadiness noted during ambulation. Does better when cues provided to slow speed and wear eye patch.                   ADL Overall ADL's : Needs assistance/impaired                         Toilet Transfer: Supervision/safety;BSC           Functional mobility during ADLs: Min guard General ADL Comments: Min guard at times due to decreased balance. Educated pt on compensation strategies for decreased balance while OOB. Educated pt on ocular motor exercises to address double vision present in right middle and superior visual fields.Educated on safety with ADLs and home setup.       Vision                 Additional Comments: Pt with noted improvement saccadic speed to right. Educated on oculomotor exercises using teach back and demonstration methods. Educated on wearing schedule for eye patch and outpatient OT recommendation.   Perception     Praxis      Cognition   Behavior During Therapy: WFL for tasks assessed/performed Overall Cognitive Status: Impaired/Different from baseline Area of Impairment: Safety/judgement          Safety/Judgement: Decreased awareness of deficits     General Comments: Pt needing cues to control speed of gait and wear eye patch to compensate for decreased balance. Pt bumping into obstacles in room. Does somewhat better with patch on but unsteadiness still noted at  times.    Extremity/Trunk Assessment               Exercises     Shoulder Instructions       General Comments      Pertinent Vitals/ Pain       Pain Assessment: No/denies pain  Home Living                                          Prior Functioning/Environment              Frequency Min 2X/week     Progress Toward Goals  OT Goals(current goals can now be found in the care plan section)  Progress towards OT goals: Progressing toward  goals  Acute Rehab OT Goals Patient Stated Goal: back to work OT Goal Formulation: With patient Time For Goal Achievement: 06/27/14 Potential to Achieve Goals: Good ADL Goals Pt Will Perform Lower Body Bathing: with modified independence;with adaptive equipment;sit to/from stand Pt Will Perform Lower Body Dressing: with modified independence;with adaptive equipment;sit to/from stand Pt Will Perform Tub/Shower Transfer: with modified independence;ambulating;shower seat  Plan Discharge plan remains appropriate    Co-evaluation                 End of Session     Activity Tolerance Patient tolerated treatment well   Patient Left in bed;with call bell/phone within reach;Other (comment) (with vascular)   Nurse Communication          Time: 1320-1340 OT Time Calculation (min): 20 min  Charges: OT General Charges $OT Visit: 1 Procedure OT Treatments $Therapeutic Activity: 8-22 mins  Pilar GrammesMathews, Niva Murren H 06/14/2014, 1:58 PM

## 2014-06-14 NOTE — Discharge Summary (Signed)
Stroke Discharge Summary  Patient ID: Andre [name]    l   MRN: [id number]      DOB: 1965-12-03  Date of Admission: 06/11/2014 Date of Discharge: 06/14/2014  Attending Physician:  Marvel PlanJindong Vaishnavi Dalby, MD, Stroke MD  Consulting Physician(s):    vascular surgery  Patient's PCP:  No primary care provider on file. [facility]  Discharge Diagnoses:  Active Problems:   ICH (intracerebral hemorrhage) BMI  Body mass index is 33 kg/(m^2).  Past Medical History  Diagnosis Date  . Hypertension    Past Surgical History  Procedure Laterality Date  . Appendectomy    . Abdominal surgery      Medications to be continued on Rehab . amLODipine  10 mg Oral Daily  . atorvastatin  20 mg Oral q1800  . heparin subcutaneous  5,000 Units Subcutaneous 3 times per day  . lisinopril  20 mg Oral BID  . metoprolol tartrate  50 mg Oral BID  . pantoprazole  40 mg Oral Daily  . senna-docusate  1 tablet Oral BID    LABORATORY STUDIES CBC    Component Value Date/Time   WBC 6.4 06/13/2014 0716   RBC 4.89 06/13/2014 0716   HGB 15.3 06/13/2014 0716   HCT 44.8 06/13/2014 0716   PLT 262 06/13/2014 0716   MCV 91.6 06/13/2014 0716   MCH 31.3 06/13/2014 0716   MCHC 34.2 06/13/2014 0716   RDW 14.2 06/13/2014 0716   LYMPHSABS 1.4 06/11/2014 0341   MONOABS 0.7 06/11/2014 0341   EOSABS 0.2 06/11/2014 0341   BASOSABS 0.1 06/11/2014 0341   CMP    Component Value Date/Time   NA 137 06/13/2014 0716   K 3.5 06/13/2014 0716   CL 100 06/13/2014 0716   CO2 30 06/13/2014 0716   GLUCOSE 103* 06/13/2014 0716   BUN 11 06/13/2014 0716   CREATININE 1.11 06/13/2014 0716   CALCIUM 9.0 06/13/2014 0716   PROT 7.4 06/11/2014 0341   ALBUMIN 3.6 06/11/2014 0341   AST 27 06/11/2014 0341   ALT 24 06/11/2014 0341   ALKPHOS 77 06/11/2014 0341   BILITOT 0.7 06/11/2014 0341   GFRNONAA 77* 06/13/2014 0716   GFRAA 89* 06/13/2014 0716   COAGS Lab Results  Component Value Date    INR 0.92 06/11/2014   Lipid Panel    Component Value Date/Time   CHOL 189 06/12/2014 0526   TRIG 165* 06/12/2014 0526   HDL 36* 06/12/2014 0526   CHOLHDL 5.3 06/12/2014 0526   VLDL 33 06/12/2014 0526   LDLCALC 120* 06/12/2014 0526   HgbA1C  Lab Results  Component Value Date   HGBA1C 5.6 06/12/2014   Cardiac Panel (last 3 results) No results for input(s): CKTOTAL, CKMB, TROPONINI, RELINDX in the last 72 hours. Urinalysis    Component Value Date/Time   COLORURINE YELLOW 06/11/2014 0410   APPEARANCEUR CLEAR 06/11/2014 0410   LABSPEC 1.013 06/11/2014 0410   PHURINE 6.0 06/11/2014 0410   GLUCOSEU NEGATIVE 06/11/2014 0410   HGBUR TRACE* 06/11/2014 0410   BILIRUBINUR NEGATIVE 06/11/2014 0410   KETONESUR NEGATIVE 06/11/2014 0410   PROTEINUR 100* 06/11/2014 0410   UROBILINOGEN 0.2 06/11/2014 0410   NITRITE NEGATIVE 06/11/2014 0410   LEUKOCYTESUR NEGATIVE 06/11/2014 0410   Urine Drug Screen     Component Value Date/Time   LABOPIA NONE DETECTED 06/11/2014 0410   COCAINSCRNUR NONE DETECTED 06/11/2014 0410   LABBENZ NONE DETECTED 06/11/2014 0410   AMPHETMU NONE DETECTED 06/11/2014 0410  THCU NONE DETECTED 06/11/2014 0410   LABBARB NONE DETECTED 06/11/2014 0410    Alcohol Level    Component Value Date/Time   ETH <5 06/11/2014 0341     SIGNIFICANT DIAGNOSTIC STUDIES Ct Head Wo Contrast 06/12/2014 IMPRESSION: Evolving 1 cm RIGHT posterior pontine intraparenchymal hematoma. Moderate to severe white matter changes suggest chronic small vessel ischemic disease, advanced for age. Remote lacunar infarcts better seen on prior MRI.   06/11/2014 IMPRESSION: Focal acute intraparenchymal hemorrhage in the posterior pons and midbrain. This is likely represent focal hemorrhagic infarct. Chronic atrophy and small vessel ischemic changes.   Mri and Mra Head Wo Contrast 06/11/2014 IMPRESSION: MRI HEAD IMPRESSION: 1. 10 x 8 mm acute parenchymal hemorrhage within the dorsal right  midbrain/pons. Underlying hypertensive bleed is suspected. 2. Additional multiple remote lacunar infarcts involving the bilateral basal ganglia and pons. 3. Moderate chronic small vessel ischemic disease. MRA HEAD IMPRESSION: Unremarkable MRA of the intracranial circulation without significant stenosis or arterial occlusion.   Carotid Doppler Bilateral: 1-39% ICA stenosis. Vertebral artery flow is antegrade  2D Echocardiogram - Left ventricle: The cavity size was normal. There was mild concentric hypertrophy. Systolic function was mildly to moderately reduced. The estimated ejection fraction was in therange of 40% to 45%. Diffuse hypokinesis. Doppler parameters areconsistent with abnormal left ventricular relaxation (grade 1diastolic dysfunction). No evidence of thrombus.  Renal Artery Ultrasound: Renal artery velocities and intra-renal indices within normal limits bilaterally. Incidental finding of infra-renal fusiform aneurysm measuring approximately 9 x 5 x 5 cm.Multi-septated right renal cyst measuring approximately 13 x 9 x 10 cm. (LxHxW)   Simple anechoic left renal cyst measuring approximately 5.5 x 4.6 x 5.0 cm. (LxHxW)    HISTORY OF PRESENT ILLNES Andre [name] is an 49 y.o. male hx of HTN presenting to the ED for right sided numbness involving face and arm. States he has pain on the right side of his face. Denies any weakness. Denies any speech issues. Notes difficulty with moving his eyes to the right. No prior CVA or TIA. Notes history of HTN but is non-compliant with medications.   CT head in the ED completed and reviewed. Shows 9mm ICH in the posterior pons and midbrain. BP has remained elevated in the ED, ranging SBP 188 to 198.   No TPA due to ICH.  HOSPITAL COURSE This is a 49 year old male with a history of hypertension who initially presented to the emergency department with right-sided numbness involving face and arm and double vision. He  was found to have a 10 x 8 mm acute parenchymal hemorrhage within the dorsal right midbrain/pons on MRI. This was also seen on initial CT scan and measured as a 9 mm posterior pons midbrain hemorrhage. His initial presenting blood pressure was 188-198 systolically. He was initially admitted to the neuro ICU and was placed on a Cardene drip and his blood pressure did improve to around 160. With improvement of his blood pressure he was discharged to the medical floor and was placed on amlodipine and lisinopril. He also had blood drawn for cholesterol and was found to have an LDL level of 120 and was started on Lipitor 20 mg daily. He also has an extensive history of smoking and smoking sensation education was provided. Overall his symptoms continue to improve as well as his blood pressure. He did undergo there is exams during his hospitalization including carotid Doppler, echocardiogram, MRI and MRA of his head as well as a CT scan of his head. Results are all  stated above. Prior to his discharge he did undergo a renal artery ultrasound that did reveal a 5 x 5 x 9 abdominal aortic aneurysm. Vascular surgery was counseled for further workup of his aneurysm. Vascular, Dr. Hart Rochester, recommended a follow up as an outpatient in 4 weeks with a CT angiogram of abdomin and pelvis and to discuss further treatment  DISCHARGE EXAM Blood pressure 157/102, pulse 79, temperature 98.4 F (36.9 C), temperature source Oral, resp. rate 20, height  (1.702 m), weight 95.6 kg (210 lb 12.2 oz), SpO2 99 %. General - Well nourished, well developed, in no apparent distress.  Ophthalmologic - Sharp disc margins OU.  Cardiovascular - Regular rate and rhythm with no murmur.  Neck - supple, no carotid bruits  Mental Status -  Level of arousal and orientation to time, place, and person were intact. Language including expression, naming, repetition, comprehension was assessed and found intact. Fund of Knowledge was assessed and  was intact.  Cranial Nerves II - XII - II - Visual field intact OU. III, IV, VI - Extraocular movements showed right partial sixth nerve palsy, diplopia on right horizontal gaze, improved. V - Facial sensation intact bilaterally. VII - Facial movement intact bilaterally. VIII - Hearing & vestibular intact bilaterally. X - Palate elevates symmetrically. XI - Chin turning & shoulder shrug intact bilaterally. XII - Tongue protrusion intact.  Motor Strength - The patient's strength was normal in all extremities and pronator drift was absent. Bulk was normal and fasciculations were absent.  Motor Tone - Muscle tone was assessed at the neck and appendages and was normal.  Reflexes - The patient's reflexes were symmetrical in all extremities and he had no pathological reflexes.  Sensory - Light touch, temperature/pinprick were assessed and were symmetrical.   Coordination - The patient had normal movements in the hands and feet with no ataxia or dysmetria. Tremor was absent.  Gait and Station - ambulating on his own without any difficulty.  Discharge Diet  Diet Heart Room service appropriate?: Yes; Fluid consistency:: Thin liquids  DISCHARGE PLAN  Disposition:  Transfer to [facility] for ongoing PT, OT and ST  no antithrombotic for secondary stroke prevention for now.  Recommend ongoing risk factor control by Primary Care Physician at time of discharge from inpatient rehabilitation.  [facility]up [facility] PCP.  Patient has indicated to [place] that he will follow up with the [facility] clinic in 2 weeks following discharge.  Follow-up with a [facility] neurologist in 4 weeks.   Follow-up with a [facility] nephrologist in 4 weeks due to large renal cyst.   Follow-up with Vascular. Dr. Hart Rochester in 4 weeks with CT angiogram of abdomin and pelvis.  60 minutes were spent preparing discharge.  Patient seen and discussed with Dr. Roda Shutters.  Rudean Hitt, PA-C Department neurology [facility]  I, the attending vascular neurologist, have personally obtained a history, examined the patient, evaluated laboratory data, individually viewed imaging studies and agree with radiology interpretations. Together with the NP/PA, we formulated the assessment and plan of care which reflects our mutual decision.  I have made any additions or clarifications directly to the above note and agree with the findings and plan as currently documented.    Marvel Plan, MD PhD Stroke Neurology 06/14/2014 6:42 PM

## 2014-06-14 NOTE — Consult Note (Signed)
[facility] CONSULT NOTE   MRN : [id number]  Reason for Consult: AAA Referring Physician: Neurology  History of Present Illness: 49 y/o male admitted due to stroke 9mm ICH in the posterior pons and midbrain.  He has a history of hypertension.  He has no prior TIA or CVA history.  He was not on any prescription medication prior to this hospitalization.       Current Facility-Administered Medications  Medication Dose Route Frequency Provider Last Rate Last Dose  . acetaminophen (TYLENOL) tablet 650 mg  650 mg Oral Q4H PRN Andre MarrowPeter J Sumner, DO      . amLODipine (NORVASC) tablet 10 mg  10 mg Oral Daily Andre PlanJindong Xu, MD   10 mg at 06/14/14 1033  . atorvastatin (LIPITOR) tablet 20 mg  20 mg Oral q1800 Andre PlanJindong Xu, MD   20 mg at 06/13/14 1754  . heparin injection 5,000 Units  5,000 Units Subcutaneous 3 times per day Andre PlanJindong Xu, MD   5,000 Units at 06/14/14 [id number]  . labetalol (NORMODYNE,TRANDATE) injection 10-40 mg  10-40 mg Intravenous Q2H PRN Andre PlanJindong Xu, MD   20 mg at 06/14/14 0700  . lisinopril (PRINIVIL,ZESTRIL) tablet 20 mg  20 mg Oral BID Andre BentonSharon L Biby, NP   20 mg at 06/14/14 1033  . metoprolol (LOPRESSOR) tablet 50 mg  50 mg Oral BID Andre PlanJindong Xu, MD   50 mg at 06/14/14 1033  . ondansetron (ZOFRAN) injection 4 mg  4 mg Intravenous Q8H PRN Andre GottronElizabeth C Deterding, MD   4 mg at 06/11/14 0659  . pantoprazole (PROTONIX) EC tablet 40 mg  40 mg Oral Daily Andre PlanJindong Xu, MD   40 mg at 06/14/14 1034  . senna-docusate (Senokot-S) tablet 1 tablet  1 tablet Oral BID Andre MarrowPeter J Sumner, DO   1 tablet at 06/14/14 1033    Pt meds include: Statin :Yes Betablocker: Yes ASA: No Other anticoagulants/antiplatelets: heparin  Past Medical History  Diagnosis Date  . Hypertension     Past Surgical History  Procedure Laterality Date  . Appendectomy    . Abdominal surgery      Social History History  Substance Use Topics  . Smoking status: Current Every Day Smoker -- 1.00 packs/day   . Smokeless tobacco: Not on file  . Alcohol Use: Yes     Comment: 40 oz beer/day    Family History History reviewed. No pertinent family history. unknown to patient  No Known Allergies   REVIEW OF SYSTEMS  General: [ ]  Weight loss, [ ]  Fever, [ ]  chills Neurologic: [ ]  Dizziness, [ ]  Blackouts, [ ]  Seizure[x]  double vision and looks to the left [ ]  Stroke, [ ]  "Mini stroke", [ ]  Slurred speech, [ ]  Temporary blindness; [ ]  weakness in arms or legs, [ ]  Hoarseness [ ]  Dysphagia Cardiac: [ ]  Chest pain/pressure, [ ]  Shortness of breath at rest [ ]  Shortness of breath with exertion, [ ]  Atrial fibrillation or irregular heartbeat  Vascular: [ ]  Pain in legs with walking, [ ]  Pain in legs at rest, [ ]  Pain in legs at night,  [ ]  Non-healing ulcer, [ ]  Blood clot in vein/DVT,   Pulmonary: [ ]  Home oxygen, [ ]  Productive cough, [ ]  Coughing up blood, [ ]  Asthma,  [ ]  Wheezing [ ]  COPD Musculoskeletal:  [ ]  Arthritis, [ ]  Low back pain, [ ]  Joint pain Hematologic: [ ]  Easy Bruising, [ ]  Anemia; [ ]  Hepatitis Gastrointestinal: [ ]  Blood in  stool,  Gastroesophageal Reflux/heartburn, Urinary:  chronic Kidney disease,  on HD -  MWF or  TTHS,  Burning with urination,  Difficulty urinating Skin:  Rashes,  Wounds Psychological:  Anxiety,  Depression  Physical Examination Filed Vitals:   06/14/14 0145 06/14/14 0639 06/14/14 1047 06/14/14 1306  BP: 161/104 164/109 151/104 157/102  Pulse: 74 70 74 79  Temp: 99.6 F (37.6 C) 98.5 F (36.9 C) 98.5 F (36.9 C) 98.4 F (36.9 C)  TempSrc: Oral Oral Oral Oral  Resp: Height:      Weight:      SpO2: 100% 100% 100% 99%   Body mass index is 33 kg/(m^2).  General:  WDWN in NAD Gait: Normal HENT: WNL Eyes: Pupils equal, fixes gaze to the left Pulmonary: normal non-labored breathing , without Rales, rhonchi,  wheezing Cardiac: RRR, without  Murmurs, rubs or gallops; No carotid bruits Abdomen:  soft, NT, no masses, palpable aortic pulse Skin: no rashes, ulcers noted;  no Gangrene , no cellulitis; no open wounds; He has significant left lower leg varicose veins.  Vascular Exam/Pulses:palpable radial, brachial,femoral, DP/PT pulses bilaterally.   Musculoskeletal: no muscle wasting or atrophy; no edema  Neurologic: A&O X 3; Appropriate Affect ;  SENSATION: normal; MOTOR FUNCTION: 5/5 Symmetric Speech is fluent/normal   Significant Diagnostic Studies: CBC Lab Results  Component Value Date   WBC 6.4 06/13/2014   HGB 15.3 06/13/2014   HCT 44.8 06/13/2014   MCV 91.6 06/13/2014   PLT 262 06/13/2014    BMET    Component Value Date/Time   NA 137 06/13/2014 0716   K 3.5 06/13/2014 0716   CL 100 06/13/2014 0716   CO2 30 06/13/2014 0716   GLUCOSE 103* 06/13/2014 0716   BUN 11 06/13/2014 0716   CREATININE 1.11 06/13/2014 0716   CALCIUM 9.0 06/13/2014 0716   GFRNONAA 77* 06/13/2014 0716   GFRAA 89* 06/13/2014 0716   Estimated Creatinine Clearance: 89.7 mL/min (by C-G formula based on Cr of 1.11).  COAG Lab Results  Component Value Date   INR 0.92 06/11/2014     Non-Invasive Vascular Imaging:   Carotid Duplex (Doppler) has been completed. Preliminary findings: Bilateral: 1-39% ICA stenosis. Vertebral artery flow is antegrade  Renal artery duplex preliminary: Incidental finding of infra-renal fusiform aneurysm measuring approximately 9 x 5 x 5 cm. (LxHxW   ASSESSMENT/[name]:   AAA    He is asymptomaitc with no back or abdominal pain.  He has palpable LE pulses without evidence of ischemic changes.     He will discuss his treatment [name] with his Andre [name] he is concerned about insurance and payment.  We want him to follow up in 4 weeks with Dr. Josephina [name] with a CTA abdomin and pelvis.   Andre [name] Andre [name] 06/14/2014 1:33  PM  Agree with above assessment Patient has 5 cm abdominal aortic aneurysm on ultrasound which I reviewed earlier today and also confirmed on physical examination This is asymptomatic  Patient will make decision regarding where he will have this treated in [facility] or by [facility] If he desires to have this treated in [place] we will see him in the office in about 4 weeks with CT angiogram to determine treatment options

## 2014-06-14 NOTE — Progress Notes (Signed)
VASCULAR LAB PRELIMINARY  PRELIMINARY  PRELIMINARY  PRELIMINARY  Renal Artery Duplex completed.    Preliminary report:  Renal artery velocities and intra-renal indices within normal limits bilaterally.    Incidental finding of infra-renal fusiform aneurysm measuring approximately 9 x 5 x 5 cm.                                                                                                                                (LxHxW).    Multi-septated right renal cyst measuring approximately 13 x 9 x 10 cm.  (LxHxW)   Simple anechoic left renal cyst measuring approximately 5.5 x 4.6 x 5.0 cm. (LxHxW)  Patient's nurse given verbal preliminary report.  Loralie ChampagneBishop, Daksh Coates F, RVT 06/14/2014, 1:20 PM

## 2014-06-14 NOTE — Progress Notes (Signed)
Talked to patient about Outpatient PT/ OT; patient refused therapy's at this time; CM informed patient that after discharge if he wanted Outpatient therapy, his physician can make the arrangements; Abelino DerrickB Jaanvi Fizer RN,BSN,MHA [phone]

## 2014-06-14 NOTE — Progress Notes (Signed)
Pt is being discharged home. Discharge instructions were given to patient and family 

## 2014-06-16 LAB — CATECHOLAMINES, FRACTIONATED, PLASMA
Epinephrine: 30 pg/mL
Norepinephrine: 103 pg/mL
TOTAL CATECHOLAMINES(NOR+ EPI): 133 pg/mL

## 2014-06-16 LAB — ALDOSTERONE + RENIN ACTIVITY W/ RATIO
ALDO / PRA Ratio: <2.9 (ref 0.0–30.0)
Aldosterone: <1 ng/dL (ref 0.0–30.0)
PRA LC/MS/MS: 0.35 ng/mL/hr

## 2014-06-16 LAB — METANEPHRINES, PLASMA
Metanephrine, Free: 53 pg/mL (ref <=57)
Normetanephrine, Free: 35 pg/mL (ref <=148)
TOTAL METANEPHRINES-PLASMA: 88 pg/mL (ref <=205)

## 2014-06-22 ENCOUNTER — Telehealth: Payer: Self-pay | Admitting: Vascular Surgery

## 2014-06-22 NOTE — Telephone Encounter (Signed)
LM for pt to call DANA to discuss if he would like to follow up here or [facility]

## 2014-06-22 NOTE — Telephone Encounter (Signed)
-----   Message from Sharee PimpleMarilyn K McChesney, RN sent at 06/14/2014  3:25 PM EDT ----- Regarding: Schedule   ----- Message -----    From: Lars MageEmma M Collins, PA-C    Sent: 06/14/2014   1:54 PM      To: [facility] Charge Pool  AAA needs CTA abdomin and pelvis prior to f/u visit with Dr. Hart RochesterLawson in 4 weeks he is a [facility] patient he will f/u with them regarding his f/u care with us.

## 2014-07-07 ENCOUNTER — Encounter: Payer: Self-pay | Admitting: Vascular Surgery

## 2014-07-07 NOTE — Telephone Encounter (Signed)
Called patient again- he answered, but declined to schedule appointment, stating that he had to go to [facility]. He said that he would call us if he needed to schedule an appointment. I expressed the need for follow up after surgery, whether here or at the [facility], needed to be within the next 2 weeks if possible.

## 2014-07-11 ENCOUNTER — Encounter: Payer: Non-veteran care | Admitting: Vascular Surgery

## 2014-08-31 NOTE — Telephone Encounter (Signed)
As of today, June 30th- I have not heard back from Andre [name] regarding his follow up. I left another message on his # to ask about his status of follow up.

## 2015-08-20 ENCOUNTER — Inpatient Hospital Stay (HOSPITAL_COMMUNITY): Payer: Non-veteran care

## 2015-08-20 ENCOUNTER — Emergency Department (HOSPITAL_COMMUNITY): Payer: Non-veteran care

## 2015-08-20 ENCOUNTER — Inpatient Hospital Stay (HOSPITAL_COMMUNITY)
Admission: EM | Admit: 2015-08-20 | Discharge: 2015-08-31 | DRG: 064 | Disposition: A | Discharge status: Skilled Nursing Facility | Payer: Non-veteran care | Attending: Neurology | Admitting: Neurology

## 2015-08-20 ENCOUNTER — Encounter (HOSPITAL_COMMUNITY): Payer: Self-pay | Admitting: Emergency Medicine

## 2015-08-20 ENCOUNTER — Other Ambulatory Visit: Payer: Self-pay

## 2015-08-20 DIAGNOSIS — I610 Nontraumatic intracerebral hemorrhage in hemisphere, subcortical: Principal | ICD-10-CM

## 2015-08-20 DIAGNOSIS — N281 Cyst of kidney, acquired: Secondary | ICD-10-CM | POA: Diagnosis present

## 2015-08-20 DIAGNOSIS — R1312 Dysphagia, oropharyngeal phase: Secondary | ICD-10-CM | POA: Diagnosis present

## 2015-08-20 DIAGNOSIS — I69322 Dysarthria following cerebral infarction: Secondary | ICD-10-CM | POA: Diagnosis present

## 2015-08-20 DIAGNOSIS — I472 Ventricular tachycardia: Secondary | ICD-10-CM | POA: Diagnosis not present

## 2015-08-20 DIAGNOSIS — Z8679 Personal history of other diseases of the circulatory system: Secondary | ICD-10-CM

## 2015-08-20 DIAGNOSIS — G936 Cerebral edema: Secondary | ICD-10-CM | POA: Diagnosis present

## 2015-08-20 DIAGNOSIS — T17908A Unspecified foreign body in respiratory tract, part unspecified causing other injury, initial encounter: Secondary | ICD-10-CM

## 2015-08-20 DIAGNOSIS — I618 Other nontraumatic intracerebral hemorrhage: Secondary | ICD-10-CM | POA: Diagnosis present

## 2015-08-20 DIAGNOSIS — I619 Nontraumatic intracerebral hemorrhage, unspecified: Secondary | ICD-10-CM

## 2015-08-20 DIAGNOSIS — I639 Cerebral infarction, unspecified: Secondary | ICD-10-CM

## 2015-08-20 DIAGNOSIS — E440 Moderate protein-calorie malnutrition: Secondary | ICD-10-CM | POA: Diagnosis present

## 2015-08-20 DIAGNOSIS — Z720 Tobacco use: Secondary | ICD-10-CM | POA: Diagnosis present

## 2015-08-20 DIAGNOSIS — R471 Dysarthria and anarthria: Secondary | ICD-10-CM | POA: Diagnosis present

## 2015-08-20 DIAGNOSIS — E876 Hypokalemia: Secondary | ICD-10-CM | POA: Diagnosis not present

## 2015-08-20 DIAGNOSIS — I615 Nontraumatic intracerebral hemorrhage, intraventricular: Secondary | ICD-10-CM | POA: Diagnosis present

## 2015-08-20 DIAGNOSIS — I161 Hypertensive emergency: Secondary | ICD-10-CM | POA: Diagnosis present

## 2015-08-20 DIAGNOSIS — I10 Essential (primary) hypertension: Secondary | ICD-10-CM | POA: Diagnosis present

## 2015-08-20 DIAGNOSIS — E785 Hyperlipidemia, unspecified: Secondary | ICD-10-CM | POA: Diagnosis present

## 2015-08-20 DIAGNOSIS — I69391 Dysphagia following cerebral infarction: Secondary | ICD-10-CM | POA: Diagnosis present

## 2015-08-20 DIAGNOSIS — I714 Abdominal aortic aneurysm, without rupture, unspecified: Secondary | ICD-10-CM | POA: Diagnosis present

## 2015-08-20 DIAGNOSIS — R402212 Coma scale, best verbal response, none, at arrival to emergency department: Secondary | ICD-10-CM | POA: Diagnosis present

## 2015-08-20 DIAGNOSIS — R402132 Coma scale, eyes open, to sound, at arrival to emergency department: Secondary | ICD-10-CM | POA: Diagnosis present

## 2015-08-20 DIAGNOSIS — G919 Hydrocephalus, unspecified: Secondary | ICD-10-CM | POA: Diagnosis present

## 2015-08-20 DIAGNOSIS — G8101 Flaccid hemiplegia affecting right dominant side: Secondary | ICD-10-CM | POA: Diagnosis present

## 2015-08-20 DIAGNOSIS — R0682 Tachypnea, not elsewhere classified: Secondary | ICD-10-CM

## 2015-08-20 DIAGNOSIS — R4701 Aphasia: Secondary | ICD-10-CM | POA: Diagnosis present

## 2015-08-20 DIAGNOSIS — R402342 Coma scale, best motor response, flexion withdrawal, at arrival to emergency department: Secondary | ICD-10-CM | POA: Diagnosis present

## 2015-08-20 DIAGNOSIS — I4729 Other ventricular tachycardia: Secondary | ICD-10-CM | POA: Diagnosis not present

## 2015-08-20 DIAGNOSIS — Z4659 Encounter for fitting and adjustment of other gastrointestinal appliance and device: Secondary | ICD-10-CM

## 2015-08-20 DIAGNOSIS — R29742 NIHSS score 42: Secondary | ICD-10-CM | POA: Diagnosis present

## 2015-08-20 DIAGNOSIS — E871 Hypo-osmolality and hyponatremia: Secondary | ICD-10-CM | POA: Diagnosis not present

## 2015-08-20 DIAGNOSIS — F17210 Nicotine dependence, cigarettes, uncomplicated: Secondary | ICD-10-CM | POA: Diagnosis present

## 2015-08-20 DIAGNOSIS — Z7289 Other problems related to lifestyle: Secondary | ICD-10-CM

## 2015-08-20 DIAGNOSIS — G8100 Flaccid hemiplegia affecting unspecified side: Secondary | ICD-10-CM | POA: Diagnosis present

## 2015-08-20 DIAGNOSIS — I119 Hypertensive heart disease without heart failure: Secondary | ICD-10-CM | POA: Diagnosis present

## 2015-08-20 DIAGNOSIS — Z8673 Personal history of transient ischemic attack (TIA), and cerebral infarction without residual deficits: Secondary | ICD-10-CM

## 2015-08-20 LAB — CBC WITH DIFFERENTIAL/PLATELET
Basophils Absolute: 0 10*3/uL (ref 0.0–0.1)
Basophils Relative: 0 %
EOS ABS: 0 10*3/uL (ref 0.0–0.7)
EOS PCT: 0 %
HCT: 39.5 % (ref 39.0–52.0)
Hemoglobin: 13.6 g/dL (ref 13.0–17.0)
LYMPHS ABS: 0.9 10*3/uL (ref 0.7–4.0)
Lymphocytes Relative: 9 %
MCH: 29.8 pg (ref 26.0–34.0)
MCHC: 34.4 g/dL (ref 30.0–36.0)
MCV: 86.6 fL (ref 78.0–100.0)
Monocytes Absolute: 0.4 10*3/uL (ref 0.1–1.0)
Monocytes Relative: 4 %
Neutro Abs: 9.4 10*3/uL — ABNORMAL HIGH (ref 1.7–7.7)
Neutrophils Relative %: 87 %
Platelets: 333 10*3/uL (ref 150–400)
RBC: 4.56 MIL/uL (ref 4.22–5.81)
RDW: 13 % (ref 11.5–15.5)
WBC: 10.9 10*3/uL — AB (ref 4.0–10.5)

## 2015-08-20 LAB — URINALYSIS, ROUTINE W REFLEX MICROSCOPIC
BILIRUBIN URINE: NEGATIVE
Glucose, UA: 100 mg/dL — AB
KETONES UR: NEGATIVE mg/dL
Leukocytes, UA: NEGATIVE
NITRITE: NEGATIVE
Protein, ur: 100 mg/dL — AB
Specific Gravity, Urine: 1.025 (ref 1.005–1.030)
pH: 6.5 (ref 5.0–8.0)

## 2015-08-20 LAB — COMPREHENSIVE METABOLIC PANEL
ALT: 13 U/L — AB (ref 17–63)
AST: 20 U/L (ref 15–41)
Albumin: 3.9 g/dL (ref 3.5–5.0)
Alkaline Phosphatase: 63 U/L (ref 38–126)
Anion gap: 10 (ref 5–15)
BILIRUBIN TOTAL: 0.9 mg/dL (ref 0.3–1.2)
BUN: 14 mg/dL (ref 6–20)
CALCIUM: 8.8 mg/dL — AB (ref 8.9–10.3)
CO2: 23 mmol/L (ref 22–32)
CREATININE: 1.22 mg/dL (ref 0.61–1.24)
Chloride: 103 mmol/L (ref 101–111)
Glucose, Bld: 115 mg/dL — ABNORMAL HIGH (ref 65–99)
Potassium: 3.3 mmol/L — ABNORMAL LOW (ref 3.5–5.1)
Sodium: 136 mmol/L (ref 135–145)
Total Protein: 7.8 g/dL (ref 6.5–8.1)

## 2015-08-20 LAB — I-STAT TROPONIN, ED: Troponin i, poc: 0 ng/mL (ref 0.00–0.08)

## 2015-08-20 LAB — PROTIME-INR
INR: 0.99 (ref 0.00–1.49)
PROTHROMBIN TIME: 12.9 s (ref 11.6–15.2)

## 2015-08-20 LAB — URINE MICROSCOPIC-ADD ON: SQUAMOUS EPITHELIAL / LPF: NONE SEEN

## 2015-08-20 LAB — CBG MONITORING, ED: Glucose-Capillary: 78 mg/dL (ref 65–99)

## 2015-08-20 LAB — APTT: aPTT: 31 seconds (ref 24–37)

## 2015-08-20 LAB — MRSA PCR SCREENING: MRSA by PCR: NEGATIVE

## 2015-08-20 MED ORDER — SENNOSIDES-DOCUSATE SODIUM 8.6-50 MG PO TABS
1.0000 | ORAL_TABLET | Freq: Two times a day (BID) | ORAL | Status: DC
  Administered 2015-08-22 – 2015-08-31 (×18): 1 via ORAL
  Filled 2015-08-20 (×18): qty 1

## 2015-08-20 MED ORDER — NICARDIPINE HCL IN NACL 20-0.86 MG/200ML-% IV SOLN
5.0000 mg/h | Freq: Once | INTRAVENOUS | Status: AC
  Administered 2015-08-20: 5 mg/h via INTRAVENOUS
  Filled 2015-08-20 (×2): qty 200

## 2015-08-20 MED ORDER — ACETAMINOPHEN 650 MG RE SUPP: 650.0000 mg | RECTAL | Status: DC | PRN

## 2015-08-20 MED ORDER — STROKE: EARLY STAGES OF RECOVERY BOOK
Freq: Once | Status: AC
  Administered 2015-08-20: 09:00:00
  Filled 2015-08-20: qty 1

## 2015-08-20 MED ORDER — CETYLPYRIDINIUM CHLORIDE 0.05 % MT LIQD
7.0000 mL | Freq: Two times a day (BID) | OROMUCOSAL | Status: DC
  Administered 2015-08-20 – 2015-08-31 (×21): 7 mL via OROMUCOSAL

## 2015-08-20 MED ORDER — LABETALOL HCL 5 MG/ML IV SOLN
20.0000 mg | Freq: Once | INTRAVENOUS | Status: AC
  Administered 2015-08-20: 20 mg via INTRAVENOUS
  Filled 2015-08-20: qty 4

## 2015-08-20 MED ORDER — IOPAMIDOL (ISOVUE-370) INJECTION 76%
100.0000 mL | Freq: Once | INTRAVENOUS | Status: AC | PRN
  Administered 2015-08-20: 100 mL via INTRAVENOUS

## 2015-08-20 MED ORDER — FAMOTIDINE IN NACL 20-0.9 MG/50ML-% IV SOLN
20.0000 mg | Freq: Two times a day (BID) | INTRAVENOUS | Status: DC
  Administered 2015-08-20 – 2015-08-24 (×9): 20 mg via INTRAVENOUS
  Filled 2015-08-20 (×9): qty 50

## 2015-08-20 MED ORDER — ACETAMINOPHEN 325 MG PO TABS
650.0000 mg | ORAL_TABLET | ORAL | Status: DC | PRN
  Administered 2015-08-25 – 2015-08-31 (×5): 650 mg via ORAL
  Filled 2015-08-20 (×9): qty 2

## 2015-08-20 MED ORDER — LABETALOL HCL 5 MG/ML IV SOLN
10.0000 mg | INTRAVENOUS | Status: DC | PRN
  Administered 2015-08-21 – 2015-08-29 (×9): 20 mg via INTRAVENOUS
  Filled 2015-08-20 (×8): qty 4

## 2015-08-20 MED ORDER — ONDANSETRON HCL 4 MG/2ML IJ SOLN
4.0000 mg | Freq: Once | INTRAMUSCULAR | Status: AC
  Administered 2015-08-20: 4 mg via INTRAVENOUS
  Filled 2015-08-20: qty 2

## 2015-08-20 MED ORDER — NICARDIPINE HCL IN NACL 20-0.86 MG/200ML-% IV SOLN
5.0000 mg/h | Freq: Once | INTRAVENOUS | Status: AC
  Administered 2015-08-20: 10 mg/h via INTRAVENOUS
  Filled 2015-08-20: qty 200

## 2015-08-20 MED ORDER — NICARDIPINE HCL IN NACL 20-0.86 MG/200ML-% IV SOLN
3.0000 mg/h | INTRAVENOUS | Status: DC
  Administered 2015-08-20 (×2): 5 mg/h via INTRAVENOUS
  Administered 2015-08-20: 6 mg/h via INTRAVENOUS
  Administered 2015-08-20: 8 mg/h via INTRAVENOUS
  Administered 2015-08-21 (×2): 5 mg/h via INTRAVENOUS
  Filled 2015-08-20 (×3): qty 200
  Filled 2015-08-20: qty 400
  Filled 2015-08-20: qty 200

## 2015-08-20 NOTE — ED Notes (Signed)
EMS was called to pt's home and found pt in his bathroom in his own emesis. Pt is responsive to voice and stimuli. Pt is able to maintain his airway at this time. Pt was hypertensive and tachycardic for EMS

## 2015-08-20 NOTE — ED Notes (Addendum)
Report given to [facility], eta 5 mins out. Paper work is ready.

## 2015-08-20 NOTE — ED Notes (Signed)
Consult to Neurosurgery 

## 2015-08-20 NOTE — ED Notes (Signed)
Spoke with doctor curtpatrick- change tritate every five minutes to max of 20, or until bp is less than 160 systolic.

## 2015-08-20 NOTE — ED Notes (Signed)
Joella PrinceJon F. RN Reported to [employer] RN [facility] at transport.

## 2015-08-20 NOTE — ED Notes (Signed)
Provider verbalized another cardene   bp 179/99 reported

## 2015-08-20 NOTE — ED Notes (Signed)
PT IN CAT SCAN

## 2015-08-20 NOTE — ED Notes (Signed)
Report called to Laymond PurserKatie Rn on [id number] - [facility] in route.

## 2015-08-20 NOTE — H&P (Signed)
Neurology H&P  CC: Altered mental Status  History is obtained from:Chart  HPI: Andre [name] is a 50 y.o. male with a history of htn who was found in the bathroom in his own vomit. He was brought to the ER via EMS where a head CT was obtained which showed a large BG hemorrhage with extensive IVH.    LKW: unclear tpa given?: no, ICH ICH Score: 2   ROS: Unable to obtain due to altered mental status.   Past Medical History  Diagnosis Date  . Hypertension      FH: unable to obtain due to AMS   Social History:  reports that he has been smoking.  He does not have any smokeless tobacco history on file. He reports that he drinks alcohol. He reports that he does not use illicit drugs.   Exam: Current vital signs: BP 153/85 mmHg  Pulse 108  Temp(Src) 99.7 F (37.6 C) (Oral)  Resp 20  SpO2 95% Vital signs in last 24 hours: Temp:  [99 F (37.2 C)-99.7 F (37.6 C)] 99.7 F (37.6 C) (06/19 0745) Pulse Rate:  [86-115] 108 (06/19 0745) Resp:  [18-34] 20 (06/19 0745) BP: (153-219)/(85-160) 153/85 mmHg (06/19 0745) SpO2:  [91 %-98 %] 95 % (06/19 0745)   Physical Exam  Constitutional: Appears well-developed and well-nourished.  Psych: Affect appropriate to situation Eyes: No scleral injection HENT: No OP obstrucion Head: Normocephalic.  Cardiovascular: Normal rate and regular rhythm.  Respiratory: Effort normal and breath sounds normal to anterior ascultation GI: Soft.  No distension. There is no tenderness.  Skin: WDI  Neuro: Mental Status: Patient is very lethargic. With extensive stimulation, I do get him to stick out his tongue, but does not reliably follow commands. He opens eyes minimally to noxious stimulation.  Cranial Nerves: II: Blink to threat bilaterally Pupils are equal, round, and reactive to light.   III,IV, VI: he does not cross midline to the right voluntarily, but does cross with oculocephalic maneuver.  V: VII: blinks to eyelid stimulation  bilaterally. XII: tongue is midline without atrophy or fasciculations.  Motor: He has a right hemiparesis, but does have some flexion vs withdrawal of both the right arm and leg.  Sensory: Responds to nox stim x 4 Cerebellar: Does not perform.     I have reviewed labs in epic and the results pertinent to this consultation are: cmp - unremarkable.   I have reviewed the images obtained: CT head - lef tBG hemorrhage with IVH  Impression: 50 yo M with likely hypertensive bleed. He will need aggressive BP control and is improved currently on cardene drip.  No evidence currently of hydrocephalus.   Recommendations: 1) Admit to ICU 2) no antiplatelets or anticoagulants 3) blood pressure control with goal systolic < 160/90 4) Frequent neuro checks 5) If symptoms worsen or there is decreased mental status, repeat stat head CT 6) PT,OT,ST   This patient is critically ill and at significant risk of neurological worsening, death and care requires constant monitoring of vital signs, hemodynamics,respiratory and cardiac monitoring, neurological assessment, discussion with family, other specialists and medical decision making of high complexity. I spent 40 minutes of neurocritical care time  in the care of  this patient.  Ritta SlotMcNeill Sya Nestler, MD [facility] [phone]  If 7pm- 7am, please page neurology on call as listed in AMION. 08/20/2015  9:26 AM

## 2015-08-20 NOTE — ED Provider Notes (Signed)
CSN: [id number]     Arrival date & time 08/20/15  0004 History  By signing my name below, I, Freida Busman, attest that this documentation has been prepared under the direction and in the presence of Melene Plan, DO . Electronically Signed: Freida Busman, Scribe. 08/20/2015. 3:08 AM  Chief Complaint  Patient presents with  . Altered Mental Status   LEVEL 5 CAVEAT DUE TO AMS  The history is provided by the EMS personnel (Triage note). No language interpreter was used.   HPI Comments:  Andre [name] is a 50 y.o. male brought in by ambulance who presents to the Emergency Department with decreased responsiveness. Per EMS, pt was found in his home bathroom in his own vomit. Pt was hypertensive and tachycardic en route. Unable to fully obtain HPI/ROS due to pt's current mental status.   Past Medical History  Diagnosis Date  . Hypertension    Past Surgical History  Procedure Laterality Date  . Appendectomy    . Abdominal surgery     History reviewed. No pertinent family history. Social History  Substance Use Topics  . Smoking status: Current Every Day Smoker -- 1.00 packs/day  . Smokeless tobacco: None  . Alcohol Use: Yes     Comment: 40 oz beer/day    Review of Systems  Unable to perform ROS: Mental status change    Allergies  Review of patient's allergies indicates no known allergies.  Home Medications   Prior to Admission medications   Medication Sig Start Date End Date Taking? Authorizing Provider  amLODipine (NORVASC) 10 MG tablet Take 1 tablet (10 mg total) by mouth daily. 06/14/14   Rudean Hitt, PA-C  atorvastatin (LIPITOR) 20 MG tablet Take 1 tablet (20 mg total) by mouth daily at 6 PM. 06/14/14   Rudean Hitt, PA-C  lisinopril (PRINIVIL,ZESTRIL) 20 MG tablet Take 1 tablet (20 mg total) by mouth 2 (two) times daily. 06/14/14   Rudean Hitt, PA-C  metoprolol (LOPRESSOR) 50 MG tablet Take 1 tablet (50 mg total) by mouth 2 (two) times daily. 06/14/14    Rudean Hitt, PA-C   BP 178/101 mmHg  Pulse 112  Temp(Src) 99 F (37.2 C) (Oral)  Resp 25  SpO2 97% Physical Exam  Constitutional: He appears well-developed and well-nourished.  Smells of ETOH  HENT:  Head: Normocephalic and atraumatic.  Eyes: EOM are normal. Pupils are equal, round, and reactive to light.  Neck: Normal range of motion. Neck supple. No JVD present.  Cardiovascular: Normal rate and regular rhythm.  Exam reveals no gallop and no friction rub.   No murmur heard. Pulmonary/Chest: No respiratory distress. He has no wheezes.  Abdominal: He exhibits no distension. There is no rebound and no guarding.  Musculoskeletal: Normal range of motion.  Neurological:  Responsive to voice-- opens eyes  No signs of trauma  Leftward eye deviation. Right-sided paralysis. Follows commands and able to move the left upper and left lower extremity.  Skin: No rash noted. No pallor.  Psychiatric: He has a normal mood and affect. His behavior is normal.  Nursing note and vitals reviewed.   ED Course  Procedures  DIAGNOSTIC STUDIES:  Oxygen Saturation is 94% on RA, adequate by my interpretation.    Labs Review Labs Reviewed  CBC WITH DIFFERENTIAL/PLATELET - Abnormal; Notable for the following:    WBC 10.9 (*)    Neutro Abs 9.4 (*)    All other components within normal limits  COMPREHENSIVE METABOLIC PANEL - Abnormal; Notable  for the following:    Potassium 3.3 (*)    Glucose, Bld 115 (*)    Calcium 8.8 (*)    ALT 13 (*)    All other components within normal limits  PROTIME-INR  APTT  URINALYSIS, ROUTINE W REFLEX MICROSCOPIC (NOT AT [facility])  CBG MONITORING, ED  I-STAT TROPOININ, ED    Imaging Review Ct Angio Head W/cm &/or Wo Cm  08/20/2015  CLINICAL DATA:  Initial valuation for acute intracranial hemorrhage. EXAM: CT ANGIOGRAPHY HEAD TECHNIQUE: Multidetector CT imaging of the head was performed using the standard protocol during bolus administration of intravenous  contrast. Multiplanar CT image reconstructions and MIPs were obtained to evaluate the vascular anatomy. CONTRAST:  100 cc of Isovue 370. COMPARISON:  Prior CT from earlier the same day as well as prior MRA from 06/11/2014. FINDINGS: CTA HEAD Anterior circulation: Visualized distal cervical segments of the internal carotid arteries are widely patent. Petrous, cavernous, and supraclinoid segments widely patent without flow-limiting stenosis. Minimal focal plaque within the cavernous left ICA noted. A1 segments, anterior communicating artery common anterior cerebral arteries well opacified. M1 segments widely patent without stenosis or occlusion. MCA bifurcations normal. Distal MCA branches well opacified and symmetric. Posterior circulation: Vertebral arteries patent to the vertebrobasilar junction. Posterior inferior cerebral arteries patent. Basilar artery widely patent. Superior cerebellar and posterior cerebral arteries well opacified bilaterally. Venous sinuses: Patent without evidence for venous sinus thrombosis. Anatomic variants: No anatomic variant. No aneurysm or vascular malformation. Delayed phase:No pathologic enhancement. IMPRESSION: 1. Mild atheromatous irregularity within the cavernous ICAs bilaterally without significant stenosis. 2. Otherwise negative CTA of the neck. No high-grade or correctable stenosis. No large vessel occlusion. No aneurysm or vascular malformation. Electronically Signed   By: Rise Mu M.D.   On: 08/20/2015 05:00   Ct Head Wo Contrast  08/20/2015  CLINICAL DATA:  51 year old male with intoxication with alcohol. History of hypertension. EXAM: CT HEAD WITHOUT CONTRAST TECHNIQUE: Contiguous axial images were obtained from the base of the skull through the vertex without intravenous contrast. COMPARISON:  Head CT dated 06/12/2014 FINDINGS: There is a 3.4 x 2.3 cm left thalamic hemorrhage with intraventricular extension. Blood fills the left lateral ventricle, third  ventricle as well as the fourth ventricle. There is mass effect and approximately 5 mm left-to-right midline shift. There is mild periventricular and deep white matter chronic microvascular ischemic changes, advanced for the patient's age. The visualized paranasal sinuses and mastoid air cells are clear. The calvarium is intact. IMPRESSION: Left thalamic hemorrhage with extension into the intraventricular system and approximately 5 mm left-to-right midline shift. These results were called by telephone at the time of interpretation on 08/20/2015 at 2:55 am to Dr. Melene Plan , who verbally acknowledged these results. Electronically Signed   By: Elgie Collard M.D.   On: 08/20/2015 02:59   I have personally reviewed and evaluated these images and lab results as part of my medical decision-making.   EKG Interpretation   Date/Time:  Monday August 20 2015 00:10:55 EDT Ventricular Rate:  90 PR Interval:    QRS Duration: 88 QT Interval:  350 QTC Calculation: 429 R Axis:   45 Text Interpretation:  Sinus rhythm Prolonged PR interval Left atrial  enlargement RSR' in V1 or V2, probably normal variant Left ventricular  hypertrophy Borderline T abnormalities, inferior leads ST elevation,  consider anterior injury No significant change since last tracing  Confirmed by Alphons Burgert MD, DANIEL ([id number]) on 08/20/2015 1:54:32 AM      MDM  Final diagnoses:  Nontraumatic subcortical hemorrhage of left cerebral hemisphere [facility](HCC)   CRITICAL CARE Performed by: Melene Planan Cinthia Rodden, DO Total critical care time: 80 minutes Critical care time was exclusive of separately billable procedures and treating other patients. Critical care was necessary to treat or prevent imminent or life-threatening deterioration. Critical care was time spent personally by me on the following activities: development of treatment plan with patient and/or surrogate as well as nursing, discussions with consultants, evaluation of patient's response to  treatment, examination of patient, obtaining history from patient or surrogate, ordering and performing treatments and interventions, ordering and review of laboratory studies, ordering and review of radiographic studies, pulse oximetry and re-evaluation of patient's condition.  50 yo M With a chief complaint of altered mental status. Patient was found in the bathroom down. On arrival to the ED was thought that he was intoxicated secondary to alcohol being near him at his house. Patient had a leftward deviated gaze and right-sided paralysis. He was started on a nicardipine drip, CT scan of the head with thalamic hemorrhage that extends into the ventricles. This was discussed with Dr. Venetia MaxonStern, neurosurgey, who reviewed the images. Recommended neuro hospitalist admission and the neuro ICU.  I personally performed the services described in this documentation, which was scribed in my presence. The recorded information has been reviewed and is accurate.   The patients results and plan were reviewed and discussed.   Any x-rays performed were independently reviewed by myself.   Differential diagnosis were considered with the presenting HPI.  Medications  labetalol (NORMODYNE,TRANDATE) injection 20 mg (20 mg Intravenous Given 08/20/15 0114)  nicardipine (CARDENE) 20mg  in 0.86% saline 200ml IV infusion (0.1 mg/ml) (5 mg/hr Intravenous Rate/Dose Change 08/20/15 0406)  ondansetron (ZOFRAN) injection 4 mg (4 mg Intravenous Given 08/20/15 0307)  iopamidol (ISOVUE-370) 76 % injection 100 mL (100 mLs Intravenous Contrast Given 08/20/15 0426)    Filed Vitals:   08/20/15 0345 08/20/15 0400 08/20/15 0415 08/20/15 0500  BP: 174/99 210/160 177/100 178/101  Pulse:  110  112  Temp:      TempSrc:      Resp: 25 22 25 25   SpO2:  92%  97%    Final diagnoses:  Nontraumatic subcortical hemorrhage of left cerebral hemisphere [facility](HCC)    Admission/ observation were discussed with the admitting physician, patient and/or  family and they are comfortable with the plan.    Melene Planan Valincia Touch, DO 08/20/15 0559

## 2015-08-21 ENCOUNTER — Inpatient Hospital Stay (HOSPITAL_COMMUNITY): Payer: Non-veteran care

## 2015-08-21 DIAGNOSIS — I610 Nontraumatic intracerebral hemorrhage in hemisphere, subcortical: Secondary | ICD-10-CM | POA: Diagnosis not present

## 2015-08-21 DIAGNOSIS — Z8679 Personal history of other diseases of the circulatory system: Secondary | ICD-10-CM

## 2015-08-21 DIAGNOSIS — R4701 Aphasia: Secondary | ICD-10-CM

## 2015-08-21 DIAGNOSIS — I69391 Dysphagia following cerebral infarction: Secondary | ICD-10-CM | POA: Diagnosis not present

## 2015-08-21 DIAGNOSIS — G8100 Flaccid hemiplegia affecting unspecified side: Secondary | ICD-10-CM | POA: Diagnosis not present

## 2015-08-21 DIAGNOSIS — Z720 Tobacco use: Secondary | ICD-10-CM | POA: Diagnosis not present

## 2015-08-21 DIAGNOSIS — I161 Hypertensive emergency: Secondary | ICD-10-CM | POA: Diagnosis not present

## 2015-08-21 DIAGNOSIS — I472 Ventricular tachycardia: Secondary | ICD-10-CM | POA: Diagnosis not present

## 2015-08-21 DIAGNOSIS — E785 Hyperlipidemia, unspecified: Secondary | ICD-10-CM | POA: Diagnosis not present

## 2015-08-21 DIAGNOSIS — I10 Essential (primary) hypertension: Secondary | ICD-10-CM | POA: Diagnosis not present

## 2015-08-21 DIAGNOSIS — E440 Moderate protein-calorie malnutrition: Secondary | ICD-10-CM | POA: Diagnosis not present

## 2015-08-21 DIAGNOSIS — Q613 Polycystic kidney, unspecified: Secondary | ICD-10-CM

## 2015-08-21 LAB — LIPID PANEL
CHOLESTEROL: 243 mg/dL — AB (ref 0–200)
HDL: 45 mg/dL (ref >40)
LDL CALC: 157 mg/dL — AB (ref 0–99)
Total CHOL/HDL Ratio: 5.4 RATIO
Triglycerides: 206 mg/dL — ABNORMAL HIGH (ref <150)
VLDL: 41 mg/dL — AB (ref 0–40)

## 2015-08-21 LAB — BASIC METABOLIC PANEL
ANION GAP: 12 (ref 5–15)
BUN: 14 mg/dL (ref 6–20)
CO2: 24 mmol/L (ref 22–32)
Calcium: 9.2 mg/dL (ref 8.9–10.3)
Chloride: 100 mmol/L — ABNORMAL LOW (ref 101–111)
Creatinine, Ser: 1.38 mg/dL — ABNORMAL HIGH (ref 0.61–1.24)
GFR, EST NON AFRICAN AMERICAN: 59 mL/min — AB (ref >60)
GLUCOSE: 119 mg/dL — AB (ref 65–99)
POTASSIUM: 3.3 mmol/L — AB (ref 3.5–5.1)
SODIUM: 136 mmol/L (ref 135–145)

## 2015-08-21 LAB — CBC
HEMATOCRIT: 42.7 % (ref 39.0–52.0)
HEMOGLOBIN: 14.3 g/dL (ref 13.0–17.0)
MCH: 29.4 pg (ref 26.0–34.0)
MCHC: 33.5 g/dL (ref 30.0–36.0)
MCV: 87.9 fL (ref 78.0–100.0)
Platelets: 297 10*3/uL (ref 150–400)
RBC: 4.86 MIL/uL (ref 4.22–5.81)
RDW: 13.1 % (ref 11.5–15.5)
WBC: 8.9 10*3/uL (ref 4.0–10.5)

## 2015-08-21 LAB — GLUCOSE, CAPILLARY: Glucose-Capillary: 124 mg/dL — ABNORMAL HIGH (ref 65–99)

## 2015-08-21 LAB — RAPID URINE DRUG SCREEN, HOSP PERFORMED
Amphetamines: NOT DETECTED
Barbiturates: NOT DETECTED
Benzodiazepines: NOT DETECTED
Cocaine: NOT DETECTED
OPIATES: NOT DETECTED
Tetrahydrocannabinol: NOT DETECTED

## 2015-08-21 LAB — TSH: TSH: 0.729 u[IU]/mL (ref 0.350–4.500)

## 2015-08-21 LAB — VITAMIN B12: VITAMIN B 12: 241 pg/mL (ref 180–914)

## 2015-08-21 MED ORDER — POTASSIUM CHLORIDE 10 MEQ/100ML IV SOLN
10.0000 meq | INTRAVENOUS | Status: AC
  Administered 2015-08-21 (×4): 10 meq via INTRAVENOUS
  Filled 2015-08-21 (×4): qty 100

## 2015-08-21 MED ORDER — SODIUM CHLORIDE 0.9 % IV SOLN
INTRAVENOUS | Status: DC
  Administered 2015-08-22 – 2015-08-30 (×5): via INTRAVENOUS

## 2015-08-21 MED ORDER — CLEVIDIPINE BUTYRATE 0.5 MG/ML IV EMUL
0.0000 mg/h | INTRAVENOUS | Status: DC
  Administered 2015-08-21: 1 mg/h via INTRAVENOUS
  Administered 2015-08-21: 11 mg/h via INTRAVENOUS
  Administered 2015-08-21: 14 mg/h via INTRAVENOUS
  Administered 2015-08-21: 15 mg/h via INTRAVENOUS
  Administered 2015-08-22: 19 mg/h via INTRAVENOUS
  Administered 2015-08-22 (×4): 21 mg/h via INTRAVENOUS
  Administered 2015-08-22: 20 mg/h via INTRAVENOUS
  Administered 2015-08-22 – 2015-08-23 (×5): 21 mg/h via INTRAVENOUS
  Administered 2015-08-23: 16 mg/h via INTRAVENOUS
  Administered 2015-08-23: 15 mg/h via INTRAVENOUS
  Administered 2015-08-23: 17 mg/h via INTRAVENOUS
  Administered 2015-08-23: 16 mg/h via INTRAVENOUS
  Administered 2015-08-23: 19 mg/h via INTRAVENOUS
  Administered 2015-08-23: 20 mg/h via INTRAVENOUS
  Administered 2015-08-23: 16 mg/h via INTRAVENOUS
  Administered 2015-08-24 (×2): 17 mg/h via INTRAVENOUS
  Filled 2015-08-21: qty 100
  Filled 2015-08-21 (×4): qty 50
  Filled 2015-08-21: qty 100
  Filled 2015-08-21 (×8): qty 50
  Filled 2015-08-21 (×2): qty 100
  Filled 2015-08-21 (×7): qty 50

## 2015-08-21 NOTE — Evaluation (Signed)
Clinical/Bedside Swallow Evaluation Patient Details  Name: Andre [name] MRN: [id number] Date of Birth: [birth year]  Today's Date: 08/21/2015 Time: SLP Start Time (ACUTE ONLY): 0830 SLP Stop Time (ACUTE ONLY): 0838 SLP Time Calculation (min) (ACUTE ONLY): 8 min  Past Medical History:  Past Medical History  Diagnosis Date  . Hypertension    Past Surgical History:  Past Surgical History  Procedure Laterality Date  . Appendectomy    . Abdominal surgery     HPI:  Andre [name] is a 50 y.o. male with a history of HTN who was found in the bathroom and and had vomitted. CT showed a large BG hemorrhage with extensive IVH. PMH: tobacco use, ETOH abuse, ICH posterior pons and midrain- no documentation of swallow assessment.    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 SLP provided max verbal and tactile stimulation to elicit phonation using visual cues and tactile input during automatic speech. He repeated "bye" and stated his name "Andre [name]." Followed one step commands with 20% accuracy. Significant right inattention/leglect and lethargy. ST will continue ST and recommend CIR.      Aspiration Risk   (mod-severe)    Diet Recommendation NPO   Medication Administration: Via alternative means    Other  Recommendations Oral Care Recommendations: Oral care QID   Follow up Recommendations  Inpatient Rehab    Frequency and Duration min 2x/week  2 weeks       Prognosis Prognosis for Safe Diet Advancement: Good Barriers to Reach Goals: Severity of deficits      Swallow Study   General HPI: Andre [name] is a 50 y.o. male with a history of HTN who was found in the bathroom and and had vomitted. CT showed a large BG hemorrhage with extensive IVH. PMH: tobacco use, ETOH abuse, ICH posterior pons and midrain- no documentation of swallow assessment.   Type of Study: Bedside Swallow Evaluation Diet Prior to this Study: NPO Temperature Spikes Noted: Yes Respiratory Status: Room air History of Recent Intubation: No Behavior/Cognition: Cooperative;Lethargic/Drowsy;Requires cueing Oral Cavity Assessment: Dry (lips dry) Oral Care Completed by SLP: Yes Oral Cavity - Dentition:  (will assess further) Self-Feeding Abilities: Needs set up;Needs assist Patient Positioning: Upright in bed Baseline Vocal Quality: Low vocal intensity Volitional Cough:  (moderately strong) Volitional Swallow: Able to elicit    Oral/Motor/Sensory Function Overall Oral Motor/Sensory Function:  (appears to have oral apraxia)   Ice Chips Ice chips: Impaired Pharyngeal Phase Impairments: Multiple swallows   Thin Liquid Thin Liquid: Impaired Presentation: Cup Oral Phase Impairments: Reduced labial seal Oral Phase Functional Implications: Right anterior spillage Pharyngeal  Phase Impairments: Multiple swallows (audible swallow)    Nectar Thick Nectar Thick Liquid: Not tested   Honey Thick Honey Thick Liquid: Not tested   Puree Puree: Impaired Pharyngeal Phase Impairments: Multiple swallows   Solid   GO   Solid: Not tested        Royce MacadamiaLitaker, Kaileia Flow Willis 08/21/2015,9:14 AM  Breck CoonsLisa Willis Lonell FaceLitaker M.Ed [employer] Pager [phone]

## 2015-08-21 NOTE — Progress Notes (Signed)
PT Cancellation Note  Patient Details Name: [name] MRN: [id number] DOB: 1965/03/08   Cancelled Treatment:    Reason Eval/Treat Not Completed: Patient not medically ready (remains on bedrest at this time, will follow)   Fabio AsaWerner, Tomasa Dobransky J 08/21/2015, 7:45 AM Charlotte Crumbevon Neyda Durango, PT DPT  [phone]

## 2015-08-21 NOTE — Evaluation (Signed)
Physical Therapy Evaluation Patient Details Name: Andre [name] MRN: [id number] DOB: 1965/03/31 Today's Date: 08/21/2015   History of Present Illness  Pt admitted on 08/20/15 s/p large basal ganglia hemorrhage with extensive IVH  Clinical Impression  Patient demonstrates deficits in functional mobility as indicated below. Will need continued skilled PT to address deficits and maximize function. Will see as indicated and progress as tolerated.  At this time, patient with significant impairments in communication as well as function. Recommend CIR consult for post acute rehabilitation.   OF NOTE: BP 157 systolic during session. Patient minimally verbal and only follow simple commands intermittently with increased time 3-5 second delay. Appears neglectful to right side.      Follow Up Recommendations CIR;Supervision/Assistance - 24 hour    Equipment Recommendations  Other (comment) (TBD)    Recommendations for Other Services Rehab consult     Precautions / Restrictions Precautions Precautions: Fall Precaution Comments: watch BP parameters <160      Mobility  Bed Mobility Overal bed mobility: Needs Assistance;+2 for physical assistance Bed Mobility: Supine to Sit     Supine to sit: Total assist;+2 for physical assistance        Transfers Overall transfer level: Needs assistance Equipment used:  (2 person face to face with gait belt and chuck pad) Transfers: Sit to/from Stand;Stand Pivot Transfers Sit to Stand: Max assist;+2 physical assistance Stand pivot transfers: Max assist;+2 physical assistance       General transfer comment: Patient reaching with LUE and able to take on weight through LLE, no advancement of LEs to pivot to chair  Ambulation/Gait                Stairs            Wheelchair Mobility    Modified Rankin (Stroke Patients Only)       Balance Overall balance assessment: Needs assistance   Sitting balance-Leahy Scale:  Poor Sitting balance - Comments: moderate to maximal assist for EOB sitting, able to use LUE to reach for bed rail Postural control: Posterior lean;Right lateral lean   Standing balance-Leahy Scale: Zero                               Pertinent Vitals/Pain Pain Assessment: Faces Faces Pain Scale: Hurts a little bit Pain Intervention(s): Monitored during session    Home Living Family/patient expects to be discharged to:: Unsure                      Prior Function Level of Independence: Independent         Comments: per chart review patient was independent PTA     Hand Dominance        Extremity/Trunk Assessment   Upper Extremity Assessment: RUE deficits/detail RUE Deficits / Details: flaccid no active motion noted         Lower Extremity Assessment: RLE deficits/detail RLE Deficits / Details: no active motion noted, does withdrawl to pain       Communication      Cognition Arousal/Alertness: Awake/alert Behavior During Therapy: Flat affect Overall Cognitive Status: Difficult to assess Area of Impairment: Orientation;Attention;Memory;Following commands;Safety/judgement;Awareness;Problem solving       Following Commands: Follows one step commands inconsistently;Follows one step commands with increased time (approximate 3-5 second processing delay for left side cues)       General Comments: patient appears to demonstrate pure neglect to the right side during  activity, tracking to audbile stimuli to midline only    General Comments      Exercises        Assessment/Plan    PT Assessment Patient needs continued PT services  PT Diagnosis Difficulty walking;Hemiplegia non-dominant side;Altered mental status   PT Problem List Decreased strength;Decreased range of motion;Decreased activity tolerance;Decreased balance;Decreased mobility;Decreased coordination;Decreased cognition;Decreased knowledge of use of DME;Decreased safety  awareness;Impaired tone  PT Treatment Interventions DME instruction;Gait training;Functional mobility training;Therapeutic activities;Therapeutic exercise;Balance training;Neuromuscular re-education;Cognitive remediation;Patient/family education;Wheelchair mobility training   PT Goals (Current goals can be found in the Care Plan section) Acute Rehab PT Goals Patient Stated Goal: none stated PT Goal Formulation: Patient unable to participate in goal setting Time For Goal Achievement: 09/04/15 Potential to Achieve Goals: Fair    Frequency Min 4X/week   Barriers to discharge        Co-evaluation               End of Session Equipment Utilized During Treatment: Gait belt Activity Tolerance: Patient tolerated treatment well Patient left: in chair;with call bell/phone within reach;with chair alarm set Nurse Communication: Mobility status         Time: 1040-1103 PT Time Calculation (min) (ACUTE ONLY): 23 min   Charges:   PT Evaluation $PT Eval High Complexity: 1 Procedure PT Treatments $Therapeutic Activity: 8-22 mins   PT G CodesFabio Asa 09/03/15, 5:04 PM Charlotte Crumb, PT DPT  [phone]

## 2015-08-21 NOTE — Care Management Note (Signed)
Case Management Note  Patient Details  Name: Andre [name] MRN: [id number] Date of Birth: 06/05/1965  Subjective/Objective:   Pt admitted on 08/20/15 s/p large basal ganglia hemorrhage with extensive IVH.  PTA, pt independent of ADLS.                   Action/Plan: Unable to locate family presently; uncertain of living arrangements prior to admission.  Will follow for discharge planning as pt progresses.    Expected Discharge Date:                  Expected Discharge Plan:  IP Rehab Facility  In-House Referral:     Discharge planning Services  CM Consult  Post Acute Care Choice:    Choice offered to:     DME Arranged:    DME Agency:     HH Arranged:    HH Agency:     Status of Service:  In process, will continue to follow  If discussed at Long Length of Stay Meetings, dates discussed:    Additional Comments:  Quintella BatonJulie W. Nadine Ryle, RN, BSN  Trauma/Neuro ICU Case Manager [phone]

## 2015-08-21 NOTE — Evaluation (Addendum)
Speech Language Pathology Evaluation Patient Details Name: Andre [name] Mcclaine MRN: [id number] DOB: 1965/03/06 Today's Date: 08/21/2015 Time: [id number] SLP Time Calculation (min) (ACUTE ONLY): 8 min  Problem List:  Patient Active Problem List   Diagnosis Date Noted  . ICH (intracerebral hemorrhage) (HCC) 06/11/2014   Past Medical History:  Past Medical History  Diagnosis Date  . Hypertension    Past Surgical History:  Past Surgical History  Procedure Laterality Date  . Appendectomy    . Abdominal surgery     HPI:  Andre [name] Schlie is a 50 y.o. male with a history of HTN who was found in the bathroom and and had vomitted. CT showed a large BG hemorrhage with extensive IVH. PMH: tobacco use, ETOH abuse, ICH posterior pons and midrain- no documentation of swallow assessment.    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 Provided max verbal and tactile stimulation to elicit phonation using visual cues and tactile input during automatic speech. He repeated "bye" and stated his name "[name]." Followed one step commands with 20% accuracy. Significant right inattention/leglect and lethargy. ST will continue ST and recommend CIR.    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 no deficits)   Cognition  Overall Cognitive Status: Impaired/Different from baseline Arousal/Alertness: Lethargic Orientation Level: Oriented to place (nodded head x 1 to place, no other response) Attention: Sustained Sustained Attention: Impaired Sustained Attention Impairment: Verbal basic Memory:  (to be assessed further) Awareness:  Impaired Awareness Impairment: Intellectual impairment;Emergent impairment;Anticipatory impairment Problem Solving:  (will assess further) Safety/Judgment:  (suspect impaired)    Comprehension  Auditory Comprehension Overall Auditory Comprehension: Impaired Yes/No Questions:  (nodded x 1 out of 10 (1/1 accurate)) Commands: Impaired One Step Basic Commands: 25-49% accurate Interfering Components: Attention Visual Recognition/Discrimination Discrimination: Not tested Reading Comprehension Reading Status: Not tested    Expression Expression Primary Mode of Expression: Other (comment) (verbal expression x 2, no spontaneous gestures) Verbal Expression Overall Verbal Expression: Impaired Initiation: Impaired Automatic Speech:  (labial movement x1 with song) Repetition: Impaired Level of Impairment: Phrase level (did repeat "bye") Naming: Impairment Confrontation: Impaired Convergent: Not tested Divergent: Not tested Pragmatics: Impairment Impairments: Eye contact;Abnormal affect Interfering Components: Attention Written Expression Dominant Hand:  (uncertain at this time) Written Expression: Not tested   Oral / Motor  Oral Motor/Sensory Function Overall Oral Motor/Sensory Function:  (appears to have oral apraxia) Motor Speech Overall Motor Speech: Impaired (opened mouth x 1 orally apraxic, will assess further)   GO                    Royce MacadamiaLitaker, Sebastyan Snodgrass Willis 08/21/2015, 9:02 AM  Breck CoonsLisa Willis Lonell FaceLitaker M.Ed [employer] Pager [phone]

## 2015-08-21 NOTE — Progress Notes (Signed)
STROKE TEAM PROGRESS NOTE   HISTORY OF PRESENT ILLNESS (per record) Andre [name] is a 50 y.o. male with a history of htn who was found in the bathroom in his own vomit. He was brought to the ER via EMS where a head CT was obtained which showed a large BG hemorrhage with extensive IVH. His LKW is unclear. ICH Score: 2. Patient was not administered IV t-PA secondary to ICH. He was admitted to the neuro ICU for further evaluation and treatment.   SUBJECTIVE (INTERVAL HISTORY) His RN is at the bedside, no family present.  Currently on Cardene. Dr. Roda Shutters discussed change to Cleviprex with RN. Potassium low. Dr. Roda Shutters discussed with CCM care team and pharmacy. Pt still has global aphasia, and right neglect, left gaze, right hemiplegia.   OBJECTIVE Temp:  [99.3 F (37.4 C)-100 F (37.8 C)] 99.5 F (37.5 C) (06/20 0800) Pulse Rate:  [81-112] 81 (06/20 0800) Cardiac Rhythm:  [-] Normal sinus rhythm (06/20 0800) Resp:  [15-31] 18 (06/20 0800) BP: (127-172)/(82-107) 146/98 mmHg (06/20 0800) SpO2:  [92 %-100 %] 97 % (06/20 0800)  CBC:  Recent Labs Lab 08/20/15 0205  WBC 10.9*  NEUTROABS 9.4*  HGB 13.6  HCT 39.5  MCV 86.6  PLT 333    Basic Metabolic Panel:  Recent Labs Lab 08/20/15 0205 08/21/15 0400  NA 136 136  K 3.3* 3.3*  CL 103 100*  CO2 23 24  GLUCOSE 115* 119*  BUN 14 14  CREATININE 1.22 1.38*  CALCIUM 8.8* 9.2    Lipid Panel:     Component Value Date/Time   CHOL 243* 08/21/2015 0947   TRIG 206* 08/21/2015 0947   HDL 45 08/21/2015 0947   CHOLHDL 5.4 08/21/2015 0947   VLDL 41* 08/21/2015 0947   LDLCALC 157* 08/21/2015 0947   HgbA1c:  Lab Results  Component Value Date   HGBA1C 5.6 06/12/2014   Urine Drug Screen:    Component Value Date/Time   LABOPIA NONE DETECTED 08/21/2015 0658   COCAINSCRNUR NONE DETECTED 08/21/2015 0658   LABBENZ NONE DETECTED 08/21/2015 0658   AMPHETMU NONE DETECTED 08/21/2015 0658   THCU NONE DETECTED 08/21/2015 0658   LABBARB NONE  DETECTED 08/21/2015 0658      IMAGING  Ct Angio Head W/cm &/or Wo Cm 08/20/2015  ADDENDUM: Additional observations with this study: The acute left thalamic parenchymal hemorrhage is not significantly changed in size relative to previous exam from earlier today, measuring 19 x 32 x 32 mm (estimated volume 9.7 cc). Similar localized vasogenic edema and mass effect. Intraventricular extension with the preponderance of intraventricular blood within the left lateral ventricle is similar, although smaller amounts of blood are present within the right lateral ventricle, third ventricle, with subsequent extension through the cerebral aqueduct into the fourth ventricle, stable. Localized left-to-right shift of approximately 5 mm and ventricular dilatation is also 1. Mild atheromatous irregularity within the cavernous ICAs bilaterally without significant stenosis. 2. Otherwise negative CTA of the neck. No high-grade or correctable stenosis. No large vessel occlusion. No aneurysm or vascular malformation.   Ct Head Wo Contrast 08/21/2015   Evolving LEFT thalamus hematoma with intraventricular extension. Mild hydrocephalus and periventricular hypodensity/interstitial edema with mild RIGHT ventricular entrapment. Stable 5 mm LEFT-to-RIGHT midline shift.   08/20/2015   Left thalamic hemorrhage with extension into the intraventricular system and approximately 5 mm left-to-right midline shift.     PHYSICAL EXAM Temp:  [99.3 F (37.4 C)-100 F (37.8 C)] 99.5 F (37.5 C) (06/20 1730) Pulse  Rate:  [73-112] 84 (06/20 1730) Resp:  [15-31] 23 (06/20 1730) BP: (136-172)/(87-109) 155/96 mmHg (06/20 1730) SpO2:  [94 %-100 %] 96 % (06/20 1730)  General - Well nourished, well developed, in no apparent distress.  Ophthalmologic - Fundi not visualized due to noncooperation.  Cardiovascular - Regular rate and rhythm.  Neuro - awake, not alert, not communicating. Global aphasia, no speech output or following commands.  Right neglect, left gaze preference, not cross midline. Blinking occasionally to visual threat on the right, but not to the left. PERRL. Right facial droop, tongue in middle in mouth. Right hemiplegia, trace withdraw on right UE and LE with pain stimulation. LUE and LLE spontaneous movement. Sensation, coordination and gait not tested.    ASSESSMENT/PLAN [name] is a 50 y.o. male with history of HTN found down in the bathroom. CT showed large left thalamic ICH.   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 No high grade stenosis  Repeat CT head again in am for evaluation of hydrocephalus  Carotid Doppler  pending   2D Echo  pending   LDL 157  HgbA1c pending  SCDs for VTE prophylaxis  Diet NPO time specified  No antithrombotic prior to admission  Ongoing aggressive stroke risk factor management  Therapy recommendations:  pending. 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   Per EPIC, there has been no follow up  Will order CTA abd/pelvis once Cre normalized  Other Active Problems  Hypokalemia 3.3, replaced with 4 runs  Elevated creatinine 1.38  Leukocytosis, 10.9    Hospital day # 1  BIBY,SHARON  [facility] See Amion for Pager information 08/21/2015 1:18 PM   This patient is critically ill due to ICH with IVH, hydrocephalus, hypertensive emergency, dysphagia and at significant risk of neurological worsening, death form hematoma expansion, obstructive hydrocephalus, heart failure, cerebral edema and brain herniation. This patient's care requires constant monitoring of vital signs, hemodynamics, respiratory and cardiac monitoring, review of multiple databases, neurological assessment, discussion with family, other specialists and medical decision making of high complexity. I spent 40 minutes of neurocritical care time in the care of this patient.  Pt admitted for left thalamic ICH with IVH and mild hydrocephalus. Currently, global aphaia with right neglect and right hemiplegia. Need to repeat CT  in am. Did not pass swallow, on IVF, and cleviprex for BP control. Speech following, may consider NG tube for meds and nutrition if not able to pass swallow tomorrow.  Marvel Plan, MD PhD Stroke Neurology 08/21/2015 6:30 PM   To contact Stroke Continuity provider, please refer to [email or web address]. After hours, contact General Neurology

## 2015-08-22 ENCOUNTER — Inpatient Hospital Stay (HOSPITAL_COMMUNITY): Payer: Non-veteran care

## 2015-08-22 ENCOUNTER — Encounter (HOSPITAL_COMMUNITY): Payer: Self-pay | Admitting: Radiology

## 2015-08-22 DIAGNOSIS — Z720 Tobacco use: Secondary | ICD-10-CM

## 2015-08-22 DIAGNOSIS — I6789 Other cerebrovascular disease: Secondary | ICD-10-CM

## 2015-08-22 DIAGNOSIS — I610 Nontraumatic intracerebral hemorrhage in hemisphere, subcortical: Secondary | ICD-10-CM | POA: Diagnosis present

## 2015-08-22 DIAGNOSIS — R0682 Tachypnea, not elsewhere classified: Secondary | ICD-10-CM | POA: Diagnosis not present

## 2015-08-22 DIAGNOSIS — I69391 Dysphagia following cerebral infarction: Secondary | ICD-10-CM | POA: Diagnosis not present

## 2015-08-22 DIAGNOSIS — G8100 Flaccid hemiplegia affecting unspecified side: Secondary | ICD-10-CM | POA: Diagnosis not present

## 2015-08-22 DIAGNOSIS — I639 Cerebral infarction, unspecified: Secondary | ICD-10-CM

## 2015-08-22 DIAGNOSIS — I472 Ventricular tachycardia: Secondary | ICD-10-CM

## 2015-08-22 DIAGNOSIS — I69322 Dysarthria following cerebral infarction: Secondary | ICD-10-CM | POA: Diagnosis not present

## 2015-08-22 DIAGNOSIS — S06359D Traumatic hemorrhage of left cerebrum with loss of consciousness of unspecified duration, subsequent encounter: Secondary | ICD-10-CM

## 2015-08-22 DIAGNOSIS — I161 Hypertensive emergency: Secondary | ICD-10-CM | POA: Diagnosis not present

## 2015-08-22 DIAGNOSIS — I4729 Other ventricular tachycardia: Secondary | ICD-10-CM | POA: Diagnosis not present

## 2015-08-22 DIAGNOSIS — I5042 Chronic combined systolic (congestive) and diastolic (congestive) heart failure: Secondary | ICD-10-CM | POA: Diagnosis not present

## 2015-08-22 DIAGNOSIS — E440 Moderate protein-calorie malnutrition: Secondary | ICD-10-CM

## 2015-08-22 DIAGNOSIS — I10 Essential (primary) hypertension: Secondary | ICD-10-CM | POA: Diagnosis not present

## 2015-08-22 LAB — HEMOGLOBIN A1C
HEMOGLOBIN A1C: 5.8 % — AB (ref 4.8–5.6)
MEAN PLASMA GLUCOSE: 120 mg/dL

## 2015-08-22 LAB — ECHOCARDIOGRAM COMPLETE
CHL CUP TV REG PEAK VELOCITY: 105 cm/s
E decel time: 203 msec
E/e' ratio: 8.37
FS: 23 % — AB (ref 28–44)
HEIGHTINCHES: 72 in
IV/PV OW: 1.24
LA diam end sys: 33 mm
LA diam index: 1.76 cm/m2
LA vol A4C: 35.7 ml
LA vol: 49.6 mL
LASIZE: 33 mm
LAVOLIN: 26.4 mL/m2
LV PW d: 8.72 mm — AB (ref 0.6–1.1)
LV SIMPSON'S DISK: 52
LV dias vol index: 49 mL/m2
LV dias vol: 92 mL (ref 62–150)
LV sys vol index: 23 mL/m2
LVEEAVG: 8.37
LVEEMED: 8.37
LVELAT: 7.83 cm/s
LVOT area: 3.8 cm2
LVOTD: 22 mm
LVSYSVOL: 44 mL (ref 21–61)
MV Dec: 203
MV pk A vel: 64.5 m/s
MVPKEVEL: 65.5 m/s
RV TAPSE: 20.7 mm
Stroke v: 48 ml
TDI e' lateral: 7.83
TDI e' medial: 4.68
TR max vel: 105 cm/s
WEIGHTICAEL: 2380.97 [oz_av]

## 2015-08-22 LAB — CBC
HCT: 44.7 % (ref 39.0–52.0)
Hemoglobin: 15.5 g/dL (ref 13.0–17.0)
MCH: 29.8 pg (ref 26.0–34.0)
MCHC: 34.7 g/dL (ref 30.0–36.0)
MCV: 86 fL (ref 78.0–100.0)
PLATELETS: 333 10*3/uL (ref 150–400)
RBC: 5.2 MIL/uL (ref 4.22–5.81)
RDW: 13 % (ref 11.5–15.5)
WBC: 9.3 10*3/uL (ref 4.0–10.5)

## 2015-08-22 LAB — BASIC METABOLIC PANEL
ANION GAP: 9 (ref 5–15)
ANION GAP: 10 (ref 5–15)
BUN: 16 mg/dL (ref 6–20)
BUN: 18 mg/dL (ref 6–20)
CHLORIDE: 100 mmol/L — AB (ref 101–111)
CHLORIDE: 101 mmol/L (ref 101–111)
CO2: 24 mmol/L (ref 22–32)
CO2: 23 mmol/L (ref 22–32)
Calcium: 9 mg/dL (ref 8.9–10.3)
Calcium: 8.9 mg/dL (ref 8.9–10.3)
Creatinine, Ser: 1.17 mg/dL (ref 0.61–1.24)
Creatinine, Ser: 1.26 mg/dL — ABNORMAL HIGH (ref 0.61–1.24)
GFR calc Af Amer: >60 mL/min (ref >60)
GFR calc non Af Amer: >60 mL/min (ref >60)
GLUCOSE: 120 mg/dL — AB (ref 65–99)
Glucose, Bld: 118 mg/dL — ABNORMAL HIGH (ref 65–99)
POTASSIUM: 3.8 mmol/L (ref 3.5–5.1)
POTASSIUM: 3.5 mmol/L (ref 3.5–5.1)
SODIUM: 134 mmol/L — AB (ref 135–145)
Sodium: 133 mmol/L — ABNORMAL LOW (ref 135–145)

## 2015-08-22 LAB — GLUCOSE, CAPILLARY
GLUCOSE-CAPILLARY: 113 mg/dL — AB (ref 65–99)
Glucose-Capillary: 112 mg/dL — ABNORMAL HIGH (ref 65–99)
Glucose-Capillary: 116 mg/dL — ABNORMAL HIGH (ref 65–99)

## 2015-08-22 LAB — HIV ANTIBODY (ROUTINE TESTING W REFLEX): HIV Screen 4th Generation wRfx: NONREACTIVE

## 2015-08-22 LAB — RPR: RPR Ser Ql: NONREACTIVE

## 2015-08-22 LAB — MAGNESIUM: Magnesium: 2.1 mg/dL (ref 1.7–2.4)

## 2015-08-22 MED ORDER — METOPROLOL TARTRATE 25 MG PO TABS
25.0000 mg | ORAL_TABLET | Freq: Two times a day (BID) | ORAL | Status: DC
  Administered 2015-08-22 – 2015-08-23 (×3): 25 mg via ORAL
  Filled 2015-08-22 (×3): qty 1

## 2015-08-22 MED ORDER — JEVITY 1.2 CAL PO LIQD
1000.0000 mL | ORAL | Status: DC
  Administered 2015-08-22 – 2015-08-26 (×4): 1000 mL
  Filled 2015-08-22 (×13): qty 1000

## 2015-08-22 MED ORDER — PRO-STAT SUGAR FREE PO LIQD
30.0000 mL | Freq: Two times a day (BID) | ORAL | Status: DC
  Administered 2015-08-22 – 2015-08-27 (×11): 30 mL
  Filled 2015-08-22 (×10): qty 30

## 2015-08-22 MED ORDER — SODIUM CHLORIDE 1 G PO TABS
1.0000 g | ORAL_TABLET | Freq: Three times a day (TID) | ORAL | Status: DC
  Administered 2015-08-22 (×2): 1 g via ORAL
  Filled 2015-08-22 (×3): qty 1

## 2015-08-22 MED ORDER — JEVITY 1.2 CAL PO LIQD
1000.0000 mL | ORAL | Status: DC
  Filled 2015-08-22 (×2): qty 1000

## 2015-08-22 MED ORDER — AMLODIPINE BESYLATE 10 MG PO TABS
10.0000 mg | ORAL_TABLET | Freq: Every day | ORAL | Status: DC
  Administered 2015-08-22 – 2015-08-31 (×10): 10 mg
  Filled 2015-08-22 (×10): qty 1

## 2015-08-22 MED ORDER — LISINOPRIL 20 MG PO TABS
20.0000 mg | ORAL_TABLET | Freq: Two times a day (BID) | ORAL | Status: DC
  Administered 2015-08-22 – 2015-08-31 (×18): 20 mg
  Filled 2015-08-22 (×19): qty 1

## 2015-08-22 NOTE — Progress Notes (Signed)
STROKE TEAM PROGRESS NOTE   SUBJECTIVE (INTERVAL HISTORY) Up in chair at bedside. Sleeping. Will awaken with stimulation but still aphasic with abulia. Left gaze and right neglect, right hemiplegia. Did not pass swallow. Need NG and TF. Repeat CT stable mild hydrocephalus. Still on cleviprex. Had 24 runs of V-tach overnight.    OBJECTIVE Temp:  [98.8 F (37.1 C)-99.9 F (37.7 C)] 98.8 F (37.1 C) (06/21 0800) Pulse Rate:  [71-112] 80 (06/21 0845) Cardiac Rhythm:  [-] Sinus tachycardia (06/21 0800) Resp:  [14-29] 17 (06/21 0845) BP: (130-179)/(84-110) 138/86 mmHg (06/21 0845) SpO2:  [94 %-100 %] 98 % (06/21 0845)  CBC:   Recent Labs Lab 08/20/15 0205 08/21/15 0947 08/22/15 0320  WBC 10.9* 8.9 9.3  NEUTROABS 9.4*  --   --   HGB 13.6 14.3 15.5  HCT 39.5 42.7 44.7  MCV 86.6 87.9 86.0  PLT 333 297 333    Basic Metabolic Panel:   Recent Labs Lab 08/21/15 0400 08/22/15 0320  NA 136 133*  K 3.3* 3.8  CL 100* 100*  CO2 24 24  GLUCOSE 119* 120*  BUN 14 16  CREATININE 1.38* 1.17  CALCIUM 9.2 9.0    Lipid Panel:     Component Value Date/Time   CHOL 243* 08/21/2015 0947   TRIG 206* 08/21/2015 0947   HDL 45 08/21/2015 0947   CHOLHDL 5.4 08/21/2015 0947   VLDL 41* 08/21/2015 0947   LDLCALC 157* 08/21/2015 0947   HgbA1c:  Lab Results  Component Value Date   HGBA1C 5.8* 08/21/2015   Urine Drug Screen:     Component Value Date/Time   LABOPIA NONE DETECTED 08/21/2015 0658   COCAINSCRNUR NONE DETECTED 08/21/2015 0658   LABBENZ NONE DETECTED 08/21/2015 0658   AMPHETMU NONE DETECTED 08/21/2015 0658   THCU NONE DETECTED 08/21/2015 0658   LABBARB NONE DETECTED 08/21/2015 0658      IMAGING  Ct Angio Head W/cm &/or Wo Cm 08/20/2015  ADDENDUM: Additional observations with this study: The acute left thalamic parenchymal hemorrhage is not significantly changed in size relative to previous exam from earlier today, measuring 19 x 32 x 32 mm (estimated volume 9.7 cc).  Similar localized vasogenic edema and mass effect. Intraventricular extension with the preponderance of intraventricular blood within the left lateral ventricle is similar, although smaller amounts of blood are present within the right lateral ventricle, third ventricle, with subsequent extension through the cerebral aqueduct into the fourth ventricle, stable. Localized left-to-right shift of approximately 5 mm and ventricular dilatation is also 1. Mild atheromatous irregularity within the cavernous ICAs bilaterally without significant stenosis. 2. Otherwise negative CTA of the neck. No high-grade or correctable stenosis. No large vessel occlusion. No aneurysm or vascular malformation.   Ct Head Wo Contrast 08/22/2015 Evolving LEFT thalamus hematoma with intraventricular extension. Stable mild hydrocephalus and interstitial edema. Stable 5 mm LEFT-to-RIGHT midline shift.  08/21/2015   Evolving LEFT thalamus hematoma with intraventricular extension. Mild hydrocephalus and periventricular hypodensity/interstitial edema with mild RIGHT ventricular entrapment. Stable 5 mm LEFT-to-RIGHT midline shift.   08/20/2015   Left thalamic hemorrhage with extension into the intraventricular system and approximately 5 mm left-to-right midline shift.   Carotid Doppler   There is 1-39% bilateral ICA stenosis. Vertebral artery flow is antegrade.   TTE - - Left ventricle: The cavity size was normal. Wall thickness was  normal. Systolic function was normal. The estimated ejection  fraction was in the range of 60% to 65%. Wall motion was normal;  there were no regional  wall motion abnormalities. Left  ventricular diastolic function parameters were normal. - Left atrium: Shadowing artifact on PSL axis images. - Atrial septum: No defect or patent foramen ovale was identified. Impressions: - No cardiac source of emboli was indentified.   PHYSICAL EXAM General - Well nourished, well developed, in no apparent  distress.  Ophthalmologic - Fundi not visualized due to noncooperation.  Cardiovascular - Regular rate and rhythm.  Neuro - awake, not alert, not communicating. Global aphasia, no speech output or following commands. Right neglect, left gaze preference, not cross midline. Blinking occasionally to visual threat on the right, but not to the left. PERRL. Right facial droop, tongue in middle in mouth. Right hemiplegia, trace withdraw on right UE and LE with pain stimulation. LUE and LLE spontaneous movement. Sensation, coordination and gait not tested.    ASSESSMENT/PLAN Mr. JB DULWORTH is a 50 y.o. male with history of HTN found down in the bathroom. CT showed large left thalamic ICH.   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 No high grade stenosis  Repeat CT head remains stable  Carotid Doppler  No significant stenosis   2D Echo  EF 60-65%  LDL 157  HgbA1c 5.8  SCDs for VTE prophylaxis Diet NPO time specified  No antithrombotic prior to admission  Ongoing aggressive stroke risk factor management  Therapy recommendations:  CIR. Consult placed  Disposition:  pending   Transient V-tach  Likely associated with ICH  EF 60-65%  Cardiology on board, appreciate recs  Put back on metoprolol  Hypertensive Emergency  BP as high as 215/120, 213/130 on arrival in setting of neuro changes  Remains On cleviprex drip  SBP goal < 160  Resume home PO meds once po access  Wean off cleviprex as able  Hyperlipidemia  Home meds:  lipitor 20  LDL 157, goal < 70  Resume lipitor once po access  Continue statin on discharge  Hyponatremia   Mild 133->134  Put on salt tablet  Dysphagia   NG tube  placed  Speech following  Other Stroke Risk Factors  Cigarette smoker  ETOH use  Hx stroke/TIA  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 Hart [place], recommended a follow up as an outpatient in 4 weeks with a CT angiogram of abdomin and pelvis and to discuss further treatment.   Per EPIC, there has been no follow up  Will order CTA abd/pelvis once Cre normalized  Other Active Problems  Hypokalemia 3.3, replaced with 4 runs  Elevated creatinine 1.38  Leukocytosis, 10.9  Hospital day # 2  Rhoderick Moody [facility] See Amion for Pager information 08/22/2015 9:21 AM   This patient is critically ill due to ICH with IVH, hydrocephalus, hypertensive emergency, dysphagia and at significant risk of neurological worsening, death form hematoma expansion, obstructive hydrocephalus, heart failure, cerebral edema and brain herniation. This patient's care requires constant monitoring of vital signs, hemodynamics, respiratory and cardiac monitoring, review of multiple databases, neurological assessment, discussion with family, other specialists and  medical decision making of high complexity. I spent 40 minutes of neurocritical care time in the care of this patient.  Pt admitted for left thalamic ICH with IVH and mild hydrocephalus. Repeat CT stable, no need for EVD or NSG consult. Still global aphaia with right neglect and right hemiplegia. Did not pass swallow, on IVF and start TF. BP controlled with maxed out cleviprex. Resume home meds. Had V-tach run and cardiology on board.  Marvel PlanJindong Kohei Antonellis, MD PhD Stroke Neurology 08/22/2015 5:14 PM    To contact Stroke Continuity provider, please refer to [email or web address]. After hours, contact General Neurology

## 2015-08-22 NOTE — Progress Notes (Signed)
Pt blood pressure goal to be <160/90. Pt on continuous clevidipine infusion. (SBP <160 but difficulty lowering DBP to <90). PRN medications given to help lower DBP with limited effect. Neurology MD notified. Ok to continue to increase clevidipine to lower DPB. Will continue to monitor.

## 2015-08-22 NOTE — Progress Notes (Signed)
Telephone call to April, [place] [facility] Transfer Coordinator regarding patient admission.  Will attempt to obtain any family or contact information from [facility], if available.    Quintella BatonJulie W. Yedidya Duddy, RN, BSN  Trauma/Neuro ICU Case Manager [phone]

## 2015-08-22 NOTE — Progress Notes (Signed)
Physical Therapy Treatment Patient Details Name: Andre [name] MRN: [id number] DOB: [birth year] Today's Date: 08/22/2015    History of Present Illness Pt admitted on 08/20/15 s/p large basal ganglia hemorrhage with extensive IVH    PT Comments    Patient seen in conjunction with OT therapy today. Patient arousing to audible stimulation, but is not engaging or follow commands this session regardless of time allowed. Patient tolerated increased time at EOB and continues to required +2 max assist to transfer to chair but does not initiate any attempts to self support in sitting or show any functional use of LUE with commands. Once in chair, patient did prop his head up on his left hand for comfort in chair. Overall this was less productive then initial evaluation. Nsg aware, Will continue to see and progress as tolerated.   Follow Up Recommendations  CIR;Supervision/Assistance - 24 hour     Equipment Recommendations  Other (comment) (TBD)    Recommendations for Other Services Rehab consult     Precautions / Restrictions Precautions Precautions: Fall Precaution Comments: watch BP parameters <160 Restrictions Weight Bearing Restrictions: No    Mobility  Bed Mobility Overal bed mobility: Needs Assistance;+2 for physical assistance Bed Mobility: Supine to Sit     Supine to sit: Total assist;+2 for physical assistance        Transfers Overall transfer level: Needs assistance Equipment used:  (2 person face to face with gait belt and chuck pad)   Sit to Stand: Max assist;+2 physical assistance Stand pivot transfers: Max assist;+2 physical assistance       General transfer comment: Able to take on weight through LE but not performing any active attempts to assist today.   Ambulation/Gait                 Stairs            Wheelchair Mobility    Modified Rankin (Stroke Patients Only)       Balance Overall balance assessment: Needs assistance    Sitting balance-Leahy Scale: Poor Sitting balance - Comments: max assist for sitting EOB, no attempts to self support with UE, right lateral lean noted Postural control: Right lateral lean   Standing balance-Leahy Scale: Zero                      Cognition Arousal/Alertness: Awake/alert Behavior During Therapy: Flat affect Overall Cognitive Status: Difficult to assess Area of Impairment: Orientation;Attention;Memory;Following commands;Safety/judgement;Awareness;Problem solving       Following Commands:  (not following commands today)       General Comments: continues to demonstrate pure neglect to right side, minimal tracking but does not come to midline today    Exercises      General Comments        Pertinent Vitals/Pain Pain Assessment: Faces Faces Pain Scale: No hurt    Home Living                      Prior Function            PT Goals (current goals can now be found in the care plan section) Acute Rehab PT Goals Patient Stated Goal: none stated PT Goal Formulation: Patient unable to participate in goal setting Time For Goal Achievement: 09/04/15 Potential to Achieve Goals: Fair Progress towards PT goals: Not progressing toward goals - comment (slightly worse then previous session, not follow commands)    Frequency  Min 4X/week    PT Plan  Current plan remains appropriate    Co-evaluation PT/OT/SLP Co-Evaluation/Treatment: Yes Reason for Co-Treatment: Complexity of the patient's impairments (multi-system involvement);Necessary to address cognition/behavior during functional activity;For patient/therapist safety PT goals addressed during session: Mobility/safety with mobility;Balance       End of Session Equipment Utilized During Treatment: Gait belt Activity Tolerance: Patient tolerated treatment well Patient left: in chair;with call bell/phone within reach;with chair alarm set     Time: [id number] PT Time Calculation (min)  (ACUTE ONLY): 21 min  Charges:  $Therapeutic Activity: 8-22 mins                    G CodesFabio Asa:      Lannis Lichtenwalner J 08/22/2015, 9:57 AM Charlotte Crumbevon Kenya Shiraishi, PT DPT  [phone]

## 2015-08-22 NOTE — Progress Notes (Signed)
Inpatient Rehabilitation  PT has recommended IP rehab.  Note that pt. has VA benefits.  If his VA benefits have SNF coverage, this would likely be the best option for pt. for his post acute rehab as [facility] does not cover [place] Rehab.  Would suggest CSW determine if pt. does have SNF benefits associated with his VA coverage.  If he does not have SNF benefits, would then recommend IP Rehab consult.  Please call if questions.  Andre PickingSusan Dabid [place] PT Inpatient Rehab Admissions Coordinator Cell [phone] Office [phone]

## 2015-08-22 NOTE — Progress Notes (Signed)
Speech Language Pathology Treatment: Dysphagia;Cognitive-Linquistic  Patient Details Name: Andre [name] MRN: [id number] DOB: May 06, 1965 Today's Date: 08/22/2015 Time: 1216-1232 SLP Time Calculation (min) (ACUTE ONLY): 16 min  Assessment / Plan / Recommendation Clinical Impression  Pt alert and accepting of ice chip trials, although pharyngeal swallow is likely delayed. Multiple swallows observed with each bolus, before culminating in explosive, wet cough. Pt did follow command to cough inconsistently, although his volitional cough is very weak. He participates in automatic speech tasks (counting, days of the week) with Max faded to [place] cues. He stated his first and last name with sentence completion and phonemic cues. Will continue to follow.   HPI HPI: Andre [name] is a [age] y.o. male with a history of HTN who was found in the bathroom and and had vomitted. CT showed a large BG hemorrhage with extensive IVH. PMH: tobacco use, ETOH abuse, ICH posterior pons and midrain- no documentation of swallow assessment.       SLP Plan  Continue with current plan of care     Recommendations  Diet recommendations: NPO Medication Administration: Via alternative means             Oral Care Recommendations: Oral care QID Follow up Recommendations: Inpatient Rehab Plan: Continue with current plan of care     GO               Maxcine HamLaura Paiewonsky, M.A. CCC-SLP [phone]  Maxcine Hamaiewonsky, Andre [name] 08/22/2015, 1:38 PM

## 2015-08-22 NOTE — Consult Note (Signed)
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 right handed male with history of hypertension as well as tobacco abuse. Per chart review patient independent prior to admission. Nurse reports he does have a brother as well as mother in the area. Admitted 08/20/2015 after being found down in the bathroom and his own vomit with altered mental status. 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 Echocardiogram pending. Urine drug screen negative. CTA angiogram of the head with no high-grade stenosis or occlusion. Carotid Dopplers with no ICA stenosis. Neurology services consulted patient did not receive TPA secondary to hemorrhage. Cardiology services consulted 08/22/2015 for non-sustained V. tach with workup currently underway. Patient is currently NPO nasogastric tubes in place. Occupational therapy evaluation completed 08/22/2015 with recommendations of physical medicine rehabilitation consult.   Review of Systems  Unable to perform ROS: medical condition   Past Medical History  Diagnosis Date  . Hypertension    Past Surgical History  Procedure Laterality Date  . Appendectomy    . Abdominal surgery     History reviewed. No pertinent family history. Social History:  reports that he has been smoking.  He does not have any smokeless tobacco history on file. He reports that he drinks alcohol. He reports that he does not use illicit drugs. Allergies: No Known Allergies Medications Prior to Admission  Medication Sig Dispense Refill  . amLODipine (NORVASC) 10 MG tablet Take 1 tablet (10 mg total) by mouth daily. 30 tablet 0  . atorvastatin (LIPITOR) 20 MG tablet Take 1 tablet (20 mg total) by mouth daily at 6 PM. 30 tablet 0  . lisinopril (PRINIVIL,ZESTRIL) 20 MG tablet Take 1 tablet (20 mg total) by mouth 2 (two)  times daily. 30 tablet 0  . metoprolol (LOPRESSOR) 50 MG tablet Take 1 tablet (50 mg total) by mouth 2 (two) times daily. 60 tablet 0    Home: Home Living Family/patient expects to be discharged to:: Unsure  Functional History: Prior Function Level of Independence: Independent Comments: per chart review patient was independent PTA Functional Status:  Mobility: Bed Mobility Overal bed mobility: Needs Assistance, +2 for physical assistance Bed Mobility: Supine to Sit Supine to sit: Total assist, +2 for physical assistance Transfers Overall transfer level: Needs assistance Equipment used:  (2 person face to face with gait belt and chuck pad) Transfers: Sit to/from Stand, Stand Pivot Transfers Sit to Stand: Max assist, +2 physical assistance Stand pivot transfers: Max assist, +2 physical assistance General transfer comment: Able to take on weight through LE but not performing any active attempts to assist today.       ADL: ADL Overall ADL's : Needs assistance/impaired General ADL Comments: Pt not following commands to participate in functional activities. Attempted grooming activities at bed level but pt not participating today. Pt seems to have R neglect; L head turn and not attending to thing on R side despite max multimodal cues.  Cognition: Cognition Overall Cognitive Status: Difficult to assess Arousal/Alertness: Lethargic Orientation Level: Oriented to person (intermittently) Attention: Sustained Sustained Attention: Impaired Sustained Attention Impairment: Verbal basic Memory:  (to be assessed further) Awareness: Impaired Awareness Impairment: Intellectual impairment, Emergent impairment, Anticipatory impairment Problem Solving:  (will assess further) Safety/Judgment:  (suspect impaired) Cognition Arousal/Alertness: Awake/alert Behavior During Therapy: Flat affect Overall Cognitive Status: Difficult to assess Area of Impairment: Orientation, Attention, Memory,  Following  commands, Safety/judgement, Awareness, Problem solving Following Commands:  (not following commands today) General Comments: continues to demonstrate pure neglect to right side, minimal tracking but does not come to midline today Difficult to assess due to: Impaired communication (aphasic)  Blood pressure 153/96, pulse 92, temperature 98.3 F (36.8 C), temperature source Axillary, resp. rate 25, height 6' (1.829 m), weight 67.5 kg (148 lb 13 oz), SpO2 97 %. Physical Exam  Vitals reviewed. Constitutional: He appears well-developed and well-nourished.  HENT:  Head: Normocephalic.  Nasogastric tube in place  Eyes: Right eye exhibits no discharge. Left eye exhibits no discharge.  Pupils sluggish to light  Neck: Normal range of motion. Neck supple. No thyromegaly present.  Cardiovascular: Normal rate and regular rhythm.   Respiratory: Effort normal and breath sounds normal.  GI: Soft. Bowel sounds are normal. He exhibits no distension.  Musculoskeletal: He exhibits no edema. Tenderness: ?  Unable to assess tenderness  Neurological:  He remained nonverbal throughout exam.  He did not follow commands.  He would fall asleep during exam. Appears to be moving LUE freely Does not withdraw to painful stimuli DTRs symmetric  Skin: Skin is warm and dry.  Psychiatric:  Unable to assess due to mentation    Results for orders placed or performed during the hospital encounter of 08/20/15 (from the past 24 hour(s))  CBC     Status: None   Collection Time: 08/22/15  3:20 AM  Result Value Ref Range   WBC 9.3 4.0 - 10.5 K/uL   RBC 5.20 4.22 - 5.81 MIL/uL   Hemoglobin 15.5 13.0 - 17.0 g/dL   HCT 53.6 64.4 - 03.4 %   MCV 86.0 78.0 - 100.0 fL   MCH 29.8 26.0 - 34.0 pg   MCHC 34.7 30.0 - 36.0 g/dL   RDW 74.2 59.5 - 63.8 %   Platelets 333 150 - 400 K/uL  Basic metabolic panel     Status: Abnormal   Collection Time: 08/22/15  3:20 AM  Result Value Ref Range   Sodium 133 (L) 135 - 145  mmol/L   Potassium 3.8 3.5 - 5.1 mmol/L   Chloride 100 (L) 101 - 111 mmol/L   CO2 24 22 - 32 mmol/L   Glucose, Bld 120 (H) 65 - 99 mg/dL   BUN 16 6 - 20 mg/dL   Creatinine, Ser 7.56 0.61 - 1.24 mg/dL   Calcium 9.0 8.9 - 43.3 mg/dL   GFR calc non Af Amer >60 >60 mL/min   GFR calc Af Amer >60 >60 mL/min   Anion gap 9 5 - 15  Magnesium     Status: None   Collection Time: 08/22/15  1:08 PM  Result Value Ref Range   Magnesium 2.1 1.7 - 2.4 mg/dL  Basic metabolic panel     Status: Abnormal   Collection Time: 08/22/15  1:08 PM  Result Value Ref Range   Sodium 134 (L) 135 - 145 mmol/L   Potassium 3.5 3.5 - 5.1 mmol/L   Chloride 101 101 - 111 mmol/L   CO2 23 22 - 32 mmol/L   Glucose, Bld 118 (H) 65 - 99 mg/dL   BUN 18 6 - 20 mg/dL   Creatinine, Ser 2.95 (H) 0.61 - 1.24 mg/dL   Calcium 8.9 8.9 - 18.8 mg/dL   GFR calc non Af Amer >60 >60 mL/min   GFR calc Af Amer >60 >60 mL/min   Anion gap 10 5 - 15   Ct Head Wo Contrast  08/22/2015  CLINICAL DATA:  Follow-up intracranial hemorrhage. EXAM: CT HEAD WITHOUT CONTRAST TECHNIQUE: Contiguous axial images were obtained from the base of the skull through the vertex without intravenous contrast. COMPARISON:  CT HEAD August 21, 2015 FINDINGS: INTRACRANIAL CONTENTS: Evolving 33 x 19 mm LEFT thalamic to corona radiata intraparenchymal hematoma with surrounding low-density vasogenic edema. Intraventricular extension of hemorrhage, with LEFT greater than RIGHT lateral ventricle blood products, blood products in the third and fourth ventricles similar to prior CT. 5 mm LEFT-to-RIGHT midline shift. Mild similar ventriculomegaly with periventricular white matter hypodensities. No acute large vascular territory infarct. Mildly effaced basal cisterns. ORBITS: The included ocular globes and orbital contents are normal. SINUSES: Minimal LEFT anterior ethmoid mucosal thickening. Mastoid air cells are well aerated. SKULL/SOFT TISSUES: No skull fracture. No significant  soft tissue swelling. IMPRESSION: Evolving LEFT thalamus hematoma with intraventricular extension. Stable mild hydrocephalus and interstitial edema. Stable 5 mm LEFT-to-RIGHT midline shift. Electronically Signed   By: Awilda Metroourtnay  Bloomer M.D.   On: 08/22/2015 02:06   Ct Head Wo Contrast  08/21/2015  CLINICAL DATA:  Follow-up intracranial hemorrhage. History of hypertension. EXAM: CT HEAD WITHOUT CONTRAST TECHNIQUE: Contiguous axial images were obtained from the base of the skull through the vertex without intravenous contrast. COMPARISON:  CT HEAD August 20, 2015 FINDINGS: INTRACRANIAL CONTENTS: Evolving 3.2 x 2.2 cm relatively stable LEFT thalamus to corona radiata intraparenchymal hematoma with surrounding low-density vasogenic edema. Intraventricular extension, with partially effaced third ventricle, stable 5 mm LEFT-to-RIGHT midline shift. Confluent periventricular white matter hypodensities with mild dilatation RIGHT lateral ventricle. Mild sulcal effacement of the convexities. Mild basilar cistern effacement. Minimal calcific atherosclerosis of the carotid bulbs. ORBITS: The included ocular globes and orbital contents are normal. SINUSES: The mastoid aircells and included paranasal sinuses are well-aerated. SKULL/SOFT TISSUES: No skull fracture. No significant soft tissue swelling. IMPRESSION: Evolving LEFT thalamus hematoma with intraventricular extension. Mild hydrocephalus and periventricular hypodensity/interstitial edema with mild RIGHT ventricular entrapment. Stable 5 mm LEFT-to-RIGHT midline shift. Electronically Signed   By: Awilda Metroourtnay  Bloomer M.D.   On: 08/21/2015 04:05   Dg Chest Port 1 View  08/21/2015  CLINICAL DATA:  Hypertension for 2 days. EXAM: PORTABLE CHEST 1 VIEW COMPARISON:  None. FINDINGS: The heart is enlarged. There is mild tortuosity of the thoracic aorta. The pulmonary hila appear normal. The lungs are clear. No pleural effusion. The bony thorax is intact. IMPRESSION: Mild cardiac  enlargement.  No acute pulmonary findings. Electronically Signed   By: Rudie MeyerP.  Gallerani M.D.   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  Records reviewed and summated above. Stroke: Continue secondary stroke prophylaxis and Risk Factor Modification listed below:   Blood Pressure Management:  Continue current medication with prn's with permisive HTN per primary team Tobacco abuse:   ?Right sided hemiparesis: fit for orthosis to prevent contractures (resting hand splint for day, wrist cock up splint at night, PRAFO, etc) Motor recovery: Fluoxetine  1. Does the need for close, 24 hr/day medical supervision in concert with the patient's rehab needs make it unreasonable for this patient to be served in a less intensive setting? Yes  2. Co-Morbidities requiring supervision/potential complications: HTN (monitor and provide prns in accordance with increased physical exertion and pain), tobacco abuse (counsel when appropriate), non-sustained V. Tach (recs per cardiology, monitor with increased activity), NPO (advance diet as tolerated), tachypnea (monitor RR and O2 Sats with increased physical exertion), CHF (Monitor in accordance with increased  physical activity and avoid UE resistance excercises) 3. Due to bladder management, bowel management, safety, skin/wound care, disease management, medication administration, pain management and patient education, does the patient require 24 hr/day rehab nursing? Yes 4. Does the patient require coordinated care of a physician, rehab nurse, PT (1-2 hrs/day, 5 days/week), OT (1-2 hrs/day, 5 days/week) and SLP (1-2 hrs/day, 5 days/week) to address physical and functional deficits in the context of the above medical diagnosis(es)? Yes Addressing deficits in the following areas: balance, endurance, locomotion, strength, transferring, bowel/bladder control,  bathing, dressing, feeding, grooming, toileting, cognition, speech, language, swallowing and psychosocial support 5. Can the patient actively participate in an intensive therapy program of at least 3 hrs of therapy per day at least 5 days per week? Potentially 6. The potential for patient to make measurable gains while on inpatient rehab is good and fair 7. Anticipated functional outcomes upon discharge from inpatient rehab are mod assist and max assist  with PT, mod assist and max assist with OT, mod assist and max assist with SLP. 8. Estimated rehab length of stay to reach the above functional goals is: 22-26 days. 9. Does the patient have adequate social supports and living environment to accommodate these discharge functional goals? Potentially 10. Anticipated D/C setting: Other 11. Anticipated post D/C treatments: SNF 12. Overall Rehab/Functional Prognosis: good and fair  RECOMMENDATIONS: This patient's condition is appropriate for continued rehabilitative care in the following setting: Will need to clarify caregiver support and insurance requirements.  Based on current level of functioning (although he is recently out from his injury), would consider CIR admission to decrease burden of care after completion of medical workup and medically stable.  Patient has agreed to participate in recommended program. Potentially Note that insurance prior authorization may be required for reimbursement for recommended care.  Comment: Rehab Admissions Coordinator to follow up.  Maryla Morrow, MD 08/22/2015

## 2015-08-22 NOTE — Progress Notes (Signed)
Pt had 26 beat run VTACH. Asymptomatic. Neurologically unchanged. Notified Neurology MD. No new orders given. Will continue to monitor.

## 2015-08-22 NOTE — Progress Notes (Signed)
VASCULAR LAB PRELIMINARY  PRELIMINARY  PRELIMINARY  PRELIMINARY  Carotid duplex has been  completed.    Bilateral:  1-39% ICA stenosis.  Vertebral artery flow is antegrade.     Jenetta Logesami Amare Bail, RVT, RDMS 08/22/2015, 12:48 PM

## 2015-08-22 NOTE — Consult Note (Addendum)
CARDIOLOGY CONSULT NOTE   Patient ID: Andre [name] MRN: [id number] DOB/AGE: 1965/07/15 50 y.o.  Admit date: 08/20/2015  Primary Physician   No primary care provider on file. Primary Cardiologist: New Requesting MD: Dr. Roda ShuttersXu Reason for Consultation: non sustained VT  HPI: [name] is a 50 year old male with a male with a past medical history of HTN.   He presented to the ED on 08/20/15 after being found down at home in his own emesis, he was responsive to voice. He had leftward deviated gaze and right sided paralysis. CT of head showed thalamic hemorrhage that extended into the ventricles.   This morning at 5:29 he had a 26 beat run of ventricular tachycardia. Nursing reports that he was asymptomatic at the time. Cardiology consulted for non sustained VT.    [name] has expressive aphasia and is unable to answer my questions. 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 It appears he gets most of his care at the [facility].   His EKG shows NSR with LVH. He does appear to have some 2 mm ST elevation in leads V2-V3, with ST depression in V5, V6, and lead II.    Past Medical History  Diagnosis Date  . Hypertension      Past Surgical History  Procedure Laterality Date  . Appendectomy    . Abdominal surgery      No Known Allergies  I have reviewed the patient's current medications . antiseptic oral rinse  7 mL Mouth Rinse BID  . famotidine (PEPCID) IV  20 mg Intravenous Q12H  . senna-docusate  1 tablet Oral BID   . sodium chloride 75 mL/hr at 08/22/15 0855  . clevidipine 21 mg/hr (08/22/15 1005)   acetaminophen **OR** acetaminophen, labetalol  Prior to Admission medications   Medication Sig Start Date End Date Taking? Authorizing Provider  amLODipine (NORVASC) 10 MG tablet Take 1 tablet (10 mg total) by mouth daily. 06/14/14   Rudean HittJoseph A Giordano, PA-C  atorvastatin (LIPITOR) 20 MG tablet Take 1 tablet (20 mg total) by mouth daily  at 6 PM. 06/14/14   Rudean HittJoseph A Giordano, PA-C  lisinopril (PRINIVIL,ZESTRIL) 20 MG tablet Take 1 tablet (20 mg total) by mouth 2 (two) times daily. 06/14/14   Rudean HittJoseph A Giordano, PA-C  metoprolol (LOPRESSOR) 50 MG tablet Take 1 tablet (50 mg total) by mouth 2 (two) times daily. 06/14/14   Rudean HittJoseph A Giordano, PA-C     Social History   Social History  . Marital Status: Legally Separated    Spouse Name: N/A  . Number of Children: N/A  . Years of Education: N/A   Occupational History  . Not on file.   Social History Main Topics  . Smoking status: Current Every Day Smoker -- 1.00 packs/day  . Smokeless tobacco: Not on file  . Alcohol Use: Yes     Comment: 40 oz beer/day  . Drug Use: No  . Sexual Activity: Not on file   Other Topics Concern  . Not on file   Social History Narrative    No family status information on file.   History reviewed. No pertinent family history.   ROS:  Full 14 point review of systems complete and found to be negative unless listed above.  Physical Exam: Blood pressure 138/86, pulse 80, temperature 98.8 F (37.1 C), temperature source Axillary, resp. rate 17, height 6' (1.829 m), weight 148 lb 13 oz (67.5 kg), SpO2 98 %.  General: Well developed, well nourished, male in no acute distress Head: , No xanthomas.  Normocephalic and atraumatic, Lungs: CTA Heart: HRRR S1 S2, no rub/gallop, no murmur. pulses are 2+ extrem.   Neck: No carotid bruits. No lymphadenopathy.  No JVD. Abdomen: Bowel sounds present, abdomen soft and non-tender without masses or hernias noted. Msk:  No spine or cva tenderness. No weakness, no joint deformities or effusions. Extremities: No clubbing or cyanosis.  No edema.  Neuro: Aphasic Psych:  Does not speak Skin: No rashes or lesions noted.  Labs:   Lab Results  Component Value Date   WBC 9.3 08/22/2015   HGB 15.5 08/22/2015   HCT 44.7 08/22/2015   MCV 86.0 08/22/2015   PLT 333 08/22/2015    Recent Labs  08/20/15 0205    INR 0.99    Recent Labs Lab 08/20/15 0205  08/22/15 0320  NA 136  < > 133*  K 3.3*  < > 3.8  CL 103  < > 100*  CO2 23  < > 24  BUN 14  < > 16  CREATININE 1.22  < > 1.17  CALCIUM 8.8*  < > 9.0  PROT 7.8  --   --   BILITOT 0.9  --   --   ALKPHOS 63  --   --   ALT 13*  --   --   AST 20  --   --   GLUCOSE 115*  < > 120*  ALBUMIN 3.9  --   --   < > = values in this interval not displayed.  Recent Labs  08/20/15 0213  TROPIPOC 0.00    Lab Results  Component Value Date   CHOL 243* 08/21/2015   HDL 45 08/21/2015   LDLCALC 157* 08/21/2015   TRIG 206* 08/21/2015    TSH  Date/Time Value Ref Range Status  08/21/2015 09:47 AM 0.729 0.350 - 4.500 uIU/mL Final  06/12/2014 05:26 AM 1.554 0.350 - 4.500 uIU/mL Final   VITAMIN B-12  Date/Time Value Ref Range Status  08/21/2015 09:47 AM 241 180 - 914 pg/mL Final    Comment:    (NOTE) This assay is not validated for testing neonatal or myeloproliferative syndrome specimens for Vitamin B12 levels.     Echo: pending   ECG: NSR, LVH   Radiology:  Ct Head Wo Contrast  08/22/2015  CLINICAL DATA:  Follow-up intracranial hemorrhage. EXAM: CT HEAD WITHOUT CONTRAST TECHNIQUE: Contiguous axial images were obtained from the base of the skull through the vertex without intravenous contrast. COMPARISON:  CT HEAD August 21, 2015 FINDINGS: INTRACRANIAL CONTENTS: Evolving 33 x 19 mm LEFT thalamic to corona radiata intraparenchymal hematoma with surrounding low-density vasogenic edema. Intraventricular extension of hemorrhage, with LEFT greater than RIGHT lateral ventricle blood products, blood products in the third and fourth ventricles similar to prior CT. 5 mm LEFT-to-RIGHT midline shift. Mild similar ventriculomegaly with periventricular white matter hypodensities. No acute large vascular territory infarct. Mildly effaced basal cisterns. ORBITS: The included ocular globes and orbital contents are normal. SINUSES: Minimal LEFT anterior ethmoid  mucosal thickening. Mastoid air cells are well aerated. SKULL/SOFT TISSUES: No skull fracture. No significant soft tissue swelling. IMPRESSION: Evolving LEFT thalamus hematoma with intraventricular extension. Stable mild hydrocephalus and interstitial edema. Stable 5 mm LEFT-to-RIGHT midline shift. Electronically Signed   By: Awilda Metro M.D.   On: 08/22/2015 02:06   Ct Head Wo Contrast  08/21/2015  CLINICAL DATA:  Follow-up intracranial hemorrhage. History of hypertension. EXAM: CT HEAD WITHOUT CONTRAST  TECHNIQUE: Contiguous axial images were obtained from the base of the skull through the vertex without intravenous contrast. COMPARISON:  CT HEAD August 20, 2015 FINDINGS: INTRACRANIAL CONTENTS: Evolving 3.2 x 2.2 cm relatively stable LEFT thalamus to corona radiata intraparenchymal hematoma with surrounding low-density vasogenic edema. Intraventricular extension, with partially effaced third ventricle, stable 5 mm LEFT-to-RIGHT midline shift. Confluent periventricular white matter hypodensities with mild dilatation RIGHT lateral ventricle. Mild sulcal effacement of the convexities. Mild basilar cistern effacement. Minimal calcific atherosclerosis of the carotid bulbs. ORBITS: The included ocular globes and orbital contents are normal. SINUSES: The mastoid aircells and included paranasal sinuses are well-aerated. SKULL/SOFT TISSUES: No skull fracture. No significant soft tissue swelling. IMPRESSION: Evolving LEFT thalamus hematoma with intraventricular extension. Mild hydrocephalus and periventricular hypodensity/interstitial edema with mild RIGHT ventricular entrapment. Stable 5 mm LEFT-to-RIGHT midline shift. Electronically Signed   By: Awilda Metro M.D.   On: 08/21/2015 04:05   Dg Chest Port 1 View  08/21/2015  CLINICAL DATA:  Hypertension for 2 days. EXAM: PORTABLE CHEST 1 VIEW COMPARISON:  None. FINDINGS: The heart is enlarged. There is mild tortuosity of the thoracic aorta. The pulmonary hila  appear normal. The lungs are clear. No pleural effusion. The bony thorax is intact. IMPRESSION: Mild cardiac enlargement.  No acute pulmonary findings. Electronically Signed   By: Rudie Meyer M.D.   On: 08/21/2015 20:56    ASSESSMENT AND PLAN:    Active Problems:   ICH (intracerebral hemorrhage) (HCC)  1. Non sustained VT: Patient had 26 beat run of VT this am. He was asymptomatic at the time. Patient is unable to answer questions at this time. Will order bmp and mg level. Echo pending. Last echo was in April 2016, his EF at that time was 40-45%, with diffuse hypokinesis.  Would benefit from the addition of metoprolol 25 mg BID.   Of note, clevedipine can cause some reflex tachycardia.   2. Abnormal EKG: Will repeat EKG. He has not had an ischemic evaluation, however he would not be a candidate at this time due to intercranial hemorrhage.   3. HTN: Managed per primary team, on clevidipine gtt. His BP remains elevated with diastolic pressures >90. He has had an evaluation in the past for renal artery stenosis in April of 2016, no stenosis noted bilaterally.    Signed: Little Ishikawa, NP 08/22/2015 11:51 AM Pager [phone]  Co-Sign MD I have examined the patient and reviewed assessment and plan and discussed with patient.  Agree with above as stated.  He is aphasic.  Looks to the left.  NSVT likely related to stroke.  Start metoprolol.  BP control.  EF known to be mildly decreased.  Will follow along.  Lance Muss

## 2015-08-22 NOTE — Progress Notes (Signed)
Echocardiogram 2D Echocardiogram has been performed.  Dorothey BasemanReel, Paxton Kanaan M 08/22/2015, 3:15 PM

## 2015-08-22 NOTE — Evaluation (Signed)
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 Currently pt requires max assist +2 for basic transfers. Pt with decreased ability to follow commands this session. Pt presenting with flaccid RUE and strong preference for L visual field impacting his independence and safety with ADLs and functional mobility. Recommending CIR level therapies for follow up to maximize independence and safety with ADLs and functional mobility prior to return home. Pt would benefit from continued skilled OT to address established goals.    Follow Up Recommendations  CIR;Supervision/Assistance - 24 hour    Equipment Recommendations  Other (comment) (TBD at next venue)    Recommendations for Other Services Rehab consult     Precautions / Restrictions Precautions Precautions: Fall Precaution Comments: watch BP parameters <160 Restrictions Weight Bearing Restrictions: No      Mobility Bed Mobility Overal bed mobility: Needs Assistance;+2 for physical assistance Bed Mobility: Supine to Sit     Supine to sit: Total assist;+2 for physical assistance        Transfers Overall transfer level: Needs assistance Equipment used:  (2 person face to face with gait belt and chuck pad)   Sit to Stand: Max assist;+2 physical assistance Stand pivot transfers: Max assist;+2 physical assistance       General transfer comment: Able to take on weight through LE but not performing any active attempts to assist today.     Balance Overall balance assessment: Needs assistance   Sitting balance-Leahy Scale: Poor Sitting balance - Comments: max assist for sitting EOB, no attempts to self support with UE, right lateral lean noted Postural control:  Right lateral lean   Standing balance-Leahy Scale: Zero                              ADL Overall ADL's : Needs assistance/impaired                                       General ADL Comments: Pt not following commands to participate in functional activities. Attempted grooming activities at bed level but pt not participating today. Pt seems to have R neglect; L head turn and not attending to thing on R side despite max multimodal cues.     Vision Vision Assessment?: Vision impaired- to be further tested in functional context Additional Comments: Unable to assess due to impaired cognition.   Perception     Praxis      Pertinent Vitals/Pain Pain Assessment: Faces Faces Pain Scale: No hurt     Hand Dominance  (uncertain)   Extremity/Trunk Assessment Upper Extremity Assessment Upper Extremity Assessment: RUE deficits/detail RUE Deficits / Details: flaccid no active motion noted   Lower Extremity Assessment Lower Extremity Assessment: Defer to PT evaluation       Communication     Cognition Arousal/Alertness: Awake/alert Behavior During Therapy: Flat affect Overall Cognitive Status: Difficult to assess Area of Impairment: Orientation;Attention;Memory;Following commands;Safety/judgement;Awareness;Problem solving       Following Commands:  (not following commands today)       General Comments: continues to demonstrate pure neglect to right side, minimal tracking but does not come to midline today  General Comments       Exercises       Shoulder Instructions      Home Living Family/patient expects to be discharged to:: Unsure                                        Prior Functioning/Environment Level of Independence: Independent        Comments: per chart review patient was independent PTA    OT Diagnosis: Generalized weakness;Cognitive deficits;Disturbance of vision;Hemiplegia dominant side;Altered mental  status   OT Problem List: Decreased strength;Decreased range of motion;Decreased activity tolerance;Impaired balance (sitting and/or standing);Impaired vision/perception;Decreased coordination;Decreased cognition;Decreased knowledge of use of DME or AE;Decreased safety awareness;Decreased knowledge of precautions;Impaired sensation;Impaired tone;Impaired UE functional use   OT Treatment/Interventions: Self-care/ADL training;Therapeutic exercise;Neuromuscular education;Energy conservation;DME and/or AE instruction;Therapeutic activities;Cognitive remediation/compensation;Visual/perceptual remediation/compensation;Patient/family education;Balance training    OT Goals(Current goals can be found in the care plan section) Acute Rehab OT Goals Patient Stated Goal: none stated OT Goal Formulation: Patient unable to participate in goal setting Time For Goal Achievement: 09/05/15 Potential to Achieve Goals: Fair ADL Goals Pt Will Perform Grooming: with min assist;sitting Pt Will Perform Upper Body Bathing: with min assist;sitting Pt Will Perform Lower Body Bathing: with max assist;sit to/from stand Pt Will Transfer to Toilet: with max assist;stand pivot transfer;bedside commode Pt Will Perform Toileting - Clothing Manipulation and hygiene: with max assist;sit to/from stand  OT Frequency: Min 2X/week   Barriers to D/C:            Co-evaluation PT/OT/SLP Co-Evaluation/Treatment: Yes Reason for Co-Treatment: Complexity of the patient's impairments (multi-system involvement);Necessary to address cognition/behavior during functional activity;For patient/therapist safety PT goals addressed during session: Mobility/safety with mobility;Balance OT goals addressed during session: ADL's and self-care;Other (comment) (functional mobility)      End of Session Equipment Utilized During Treatment: Gait belt  Activity Tolerance: Patient tolerated treatment well Patient left: in chair;with call bell/phone  within reach;with family/visitor present   Time: 8119-1478 OT Time Calculation (min): 19 min Charges:  OT General Charges $OT Visit: 1 Procedure OT Evaluation $OT Eval High Complexity: 1 Procedure G-Codes:     Gaye Alken M.S., OTR/L Pager: [phone]  08/22/2015, 12:05 PM

## 2015-08-22 NOTE — Progress Notes (Signed)
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  INTERVENTION:   Initiate Jevity 1.2 @ 65 ml/hr via Cortrak tube   30 ml Prostat BID.    Tube feeding regimen provides 2072 kcal, 116 grams of protein, and 1263 ml of H2O.    NUTRITION DIAGNOSIS:   Inadequate oral intake related to inability to eat as evidenced by NPO status.  GOAL:   Patient will meet greater than or equal to 90% of their needs  MONITOR:   Diet advancement, Labs, TF tolerance  REASON FOR ASSESSMENT:   Consult Enteral/tube feeding initiation and management  ASSESSMENT:   Pt with a history of HTN, ETOH abuse, ICH posterior pons and midbrain. Admitted after being found down, CT showed a large BG hemorrhage with extensive IVH.   Labs reviewed: Na 134  Medications reviewed and include: senokot-s Cleviprex @ 42 ml/hr = 2016 kcal per day from lipid, hopeful to decrease once tube in place and can start oral hypertensive.   Unable to obtain nutrition hx.  Nutrition-Focused physical exam completed. Findings are mild fat depletion, mild muscle depletion, and no edema.   Pt discussed during ICU rounds and with RN.    Diet Order:  Diet NPO time specified  Skin:  Reviewed, no issues  Last BM:  unknown  Height:   Ht Readings from Last 1 Encounters:  08/20/15 6' (1.829 m)    Weight:   Wt Readings from Last 1 Encounters:  08/20/15 148 lb 13 oz (67.5 kg)    Ideal Body Weight:  80.9 kg  BMI:  Body mass index is 20.18 kg/(m^2).  Estimated Nutritional Needs:   Kcal:  2000-2200  Protein:  100-115 grams  Fluid:  > 2 L/day  EDUCATION NEEDS:   No education needs identified at this time  Kendell BaneHeather Bernadene Garside RD, LDN, CNSC [phone] Pager [phone] After Hours Pager

## 2015-08-23 ENCOUNTER — Inpatient Hospital Stay (HOSPITAL_COMMUNITY): Payer: Non-veteran care

## 2015-08-23 DIAGNOSIS — I610 Nontraumatic intracerebral hemorrhage in hemisphere, subcortical: Secondary | ICD-10-CM | POA: Diagnosis not present

## 2015-08-23 DIAGNOSIS — I472 Ventricular tachycardia: Secondary | ICD-10-CM | POA: Diagnosis not present

## 2015-08-23 DIAGNOSIS — Z720 Tobacco use: Secondary | ICD-10-CM | POA: Diagnosis not present

## 2015-08-23 DIAGNOSIS — I615 Nontraumatic intracerebral hemorrhage, intraventricular: Secondary | ICD-10-CM

## 2015-08-23 DIAGNOSIS — I161 Hypertensive emergency: Secondary | ICD-10-CM | POA: Diagnosis present

## 2015-08-23 DIAGNOSIS — I10 Essential (primary) hypertension: Secondary | ICD-10-CM | POA: Diagnosis not present

## 2015-08-23 DIAGNOSIS — E440 Moderate protein-calorie malnutrition: Secondary | ICD-10-CM | POA: Diagnosis not present

## 2015-08-23 LAB — VAS US CAROTID
LEFT ECA DIAS: -11 cm/s
LEFT VERTEBRAL DIAS: -13 cm/s
Left CCA dist dias: -11 cm/s
Left CCA dist sys: -71 cm/s
Left CCA prox dias: 3 cm/s
Left CCA prox sys: 98 cm/s
Left ICA dist dias: -17 cm/s
Left ICA dist sys: -76 cm/s
Left ICA prox dias: -6 cm/s
Left ICA prox sys: -43 cm/s
RCCADSYS: -49 cm/s
RCCAPDIAS: 8 cm/s
RIGHT ECA DIAS: -24 cm/s
RIGHT VERTEBRAL DIAS: -7 cm/s
Right CCA prox sys: 83 cm/s

## 2015-08-23 LAB — CBC
HEMATOCRIT: 41.8 % (ref 39.0–52.0)
Hemoglobin: 14.4 g/dL (ref 13.0–17.0)
MCH: 29.6 pg (ref 26.0–34.0)
MCHC: 34.4 g/dL (ref 30.0–36.0)
MCV: 86 fL (ref 78.0–100.0)
PLATELETS: 305 10*3/uL (ref 150–400)
RBC: 4.86 MIL/uL (ref 4.22–5.81)
RDW: 12.9 % (ref 11.5–15.5)
WBC: 9 10*3/uL (ref 4.0–10.5)

## 2015-08-23 LAB — BASIC METABOLIC PANEL
ANION GAP: 8 (ref 5–15)
BUN: 22 mg/dL — ABNORMAL HIGH (ref 6–20)
CALCIUM: 8.6 mg/dL — AB (ref 8.9–10.3)
CO2: 22 mmol/L (ref 22–32)
CREATININE: 1.3 mg/dL — AB (ref 0.61–1.24)
Chloride: 103 mmol/L (ref 101–111)
GLUCOSE: 112 mg/dL — AB (ref 65–99)
Potassium: 3.8 mmol/L (ref 3.5–5.1)
Sodium: 133 mmol/L — ABNORMAL LOW (ref 135–145)

## 2015-08-23 LAB — GLUCOSE, CAPILLARY
GLUCOSE-CAPILLARY: 123 mg/dL — AB (ref 65–99)
GLUCOSE-CAPILLARY: 115 mg/dL — AB (ref 65–99)
GLUCOSE-CAPILLARY: 111 mg/dL — AB (ref 65–99)
GLUCOSE-CAPILLARY: 116 mg/dL — AB (ref 65–99)
Glucose-Capillary: 127 mg/dL — ABNORMAL HIGH (ref 65–99)

## 2015-08-23 MED ORDER — AMANTADINE HCL 50 MG/5ML PO SYRP
150.0000 mg | ORAL_SOLUTION | Freq: Two times a day (BID) | ORAL | Status: AC
  Administered 2015-08-23 – 2015-08-30 (×14): 150 mg via ORAL
  Filled 2015-08-23 (×15): qty 15

## 2015-08-23 MED ORDER — HYDRALAZINE HCL 50 MG PO TABS
50.0000 mg | ORAL_TABLET | Freq: Three times a day (TID) | ORAL | Status: DC
  Administered 2015-08-23 – 2015-08-29 (×17): 50 mg via ORAL
  Filled 2015-08-23 (×17): qty 1

## 2015-08-23 MED ORDER — SODIUM CHLORIDE 1 G PO TABS
2.0000 g | ORAL_TABLET | Freq: Three times a day (TID) | ORAL | Status: DC
  Administered 2015-08-23 – 2015-08-27 (×11): 2 g via ORAL
  Filled 2015-08-23 (×12): qty 2

## 2015-08-23 MED ORDER — METOPROLOL TARTRATE 50 MG PO TABS
50.0000 mg | ORAL_TABLET | Freq: Two times a day (BID) | ORAL | Status: DC
  Administered 2015-08-23: 50 mg via ORAL
  Filled 2015-08-23: qty 1

## 2015-08-23 NOTE — Progress Notes (Signed)
Speech Language Pathology Treatment: Dysphagia;Cognitive-Linquistic  Patient Details Name: Andre [name] MRN: [id number] DOB: November 11, 1965 Today's Date: 08/23/2015 Time: [id number] SLP Time Calculation (min) (ACUTE ONLY): 9 min  Assessment / Plan / Recommendation Clinical Impression  Pt is sleepy this afternoon and as a result not as responsive to cueing for verbal output even during automatic speech tasks. He does however have good oral acceptance and transit of PO trials. Ice chips, thin liquids, and purees all elicit multiple swallows that persist even after trials have stopped. Pt could be sensing NGT, but given acute BG hemorrhage, prior ICH in the pons and midbrain, and current cognitive-linguistic status that limits ability to follow commands and assess vocal quality, MBS is warranted to assess for safety prior to initiating PO intake. Will attempt on next date.   HPI HPI: Andre [name] is a 50 y.o. male with a history of HTN who was found in the bathroom and and had vomitted. CT showed a large BG hemorrhage with extensive IVH. PMH: tobacco use, ETOH abuse, ICH posterior pons and midrain- no documentation of swallow assessment.       SLP Plan  Continue with current plan of care     Recommendations  Diet recommendations: NPO Medication Administration: Via alternative means             Oral Care Recommendations: Oral care QID Follow up Recommendations: Inpatient Rehab Plan: Continue with current plan of care     GO               Maxcine HamLaura Paiewonsky, M.A. CCC-SLP [phone]  Maxcine Hamaiewonsky, Samanthajo Payano 08/23/2015, 4:10 PM

## 2015-08-23 NOTE — Progress Notes (Signed)
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 Patient noted to visible track across midline today. Remains non-verbal throughout session. Intermittently follows commands with significant delay. Performed trunk control activities today. Will need continued rehabilitation.    Follow Up Recommendations  CIR;Supervision/Assistance - 24 hour     Equipment Recommendations  Other (comment) (TBD)    Recommendations for Other Services Rehab consult     Precautions / Restrictions Precautions Precautions: Fall Precaution Comments: watch BP parameters <160    Mobility  Bed Mobility Overal bed mobility: Needs Assistance;+2 for physical assistance Bed Mobility: Supine to Sit     Supine to sit: Total assist;+2 for physical assistance        Transfers Overall transfer level: Needs assistance Equipment used:  (2 person face to face with gait belt and chuck pad)   Sit to Stand: Max assist;+2 physical assistance Stand pivot transfers: Max assist;+2 physical assistance       General transfer comment: patient was able to initiate activation for standing upon command (with delay). increased time to perform.   Ambulation/Gait                 Stairs            Wheelchair Mobility    Modified Rankin (Stroke Patients Only)       Balance     Sitting balance-Leahy Scale: Poor Sitting balance - Comments: moderate assist for sitting EOB     Standing balance-Leahy Scale: Zero                      Cognition Arousal/Alertness: Awake/alert Behavior During Therapy: Flat affect Overall Cognitive Status: Difficult to assess Area of Impairment: Orientation;Attention;Memory;Following  commands;Safety/judgement;Awareness;Problem solving       Following Commands: Follows one step commands inconsistently;Follows one step commands with increased time (approximate 3-5 second processing delay for left side cues)            Exercises Other Exercises Other Exercises: Performed sitting balance activities at EOB, patient with poor ability to respond to cues, faciliatation for fwd/backward leans Other Exercises: Performed PROM all extremities prior to mobility    General Comments        Pertinent Vitals/Pain Pain Assessment: No/denies pain    Home Living                      Prior Function            PT Goals (current goals can now be found in the care plan section) Acute Rehab PT Goals Patient Stated Goal: none stated PT Goal Formulation: Patient unable to participate in goal setting Time For Goal Achievement: 09/04/15 Potential to Achieve Goals: Fair Progress towards PT goals: Progressing toward goals    Frequency  Min 4X/week    PT Plan Current plan remains appropriate    Co-evaluation             End of Session Equipment Utilized During Treatment: Gait belt Activity Tolerance: Patient tolerated treatment well Patient left: in chair;with call bell/phone within reach;with chair alarm set     Time: 1004-1026 PT Time Calculation (min) (ACUTE ONLY): 22 min  Charges:  $Therapeutic Activity: 8-22 mins  G CodesFabio Asa:      Yaphet Smethurst J 08/23/2015, 12:50 PM Charlotte Crumbevon Reshad Saab, PT DPT  [phone]

## 2015-08-23 NOTE — Progress Notes (Signed)
Feeding tube slipped out partially- unknown if pt pulled or tape came loose. Replaced and auscultated position. Xray ordered to confirm. TF stopped until placement confirmed.

## 2015-08-23 NOTE — Progress Notes (Signed)
I contacted pt's brother, Durwin NoraWinston, by phone. He states pt lives in an apartment with a roommate. They are related by father. Durwin NoraWinston is trying to contact pt's Mom and find out her name so that he can assist in planning dispo. Durwin NoraWinston works out of town a lot and unable to provide care for him at d/c. He intends to visit pt tomorrow around 9 or 10 am. I will continue to follow to assist with planning dispo. [phone]

## 2015-08-23 NOTE — Progress Notes (Signed)
SUBJECTIVE:  No further arrhythmia per the nurse.  BP improved.  OBJECTIVE:   Vitals:   Filed Vitals:   08/23/15 0815 08/23/15 0830 08/23/15 0845 08/23/15 0900  BP: 139/82 136/85 130/82 130/80  Pulse: 77 80 82 80  Temp:      TempSrc:      Resp: 17 18 19 18   Height:      Weight:      SpO2: 96% 97% 96% 96%   I&O's:   Intake/Output Summary (Last 24 hours) at 08/23/15 0925 Last data filed at 08/23/15 0800  Gross per 24 hour  Intake 3239.56 ml  Output   1250 ml  Net 1989.56 ml   TELEMETRY: Reviewed telemetry pt in *NSR, no further NSVT:     PHYSICAL EXAM General: Leftward preference Head:   Normal cephalic and atramatic  Lungs:  No wheezing bilaterally to auscultation. Heart:   HRRR S1 S2  No JVD.   Abdomen: abdomen soft and non-tender  Extremities:   No edema.   Neuro: sleeping Psych:  Sleeping, aphasic yesterday Skin: No rash   LABS: Basic Metabolic Panel:  Recent Labs  16/12/9604/21/17 1308 08/23/15 0411  NA 134* 133*  K 3.5 3.8  CL 101 103  CO2 23 22  GLUCOSE 118* 112*  BUN 18 22*  CREATININE 1.26* 1.30*  CALCIUM 8.9 8.6*  MG 2.1  --    Liver Function Tests: No results for input(s): AST, ALT, ALKPHOS, BILITOT, PROT, ALBUMIN in the last 72 hours. No results for input(s): LIPASE, AMYLASE in the last 72 hours. CBC:  Recent Labs  08/22/15 0320 08/23/15 0411  WBC 9.3 9.0  HGB 15.5 14.4  HCT 44.7 41.8  MCV 86.0 86.0  PLT 333 305   Cardiac Enzymes: No results for input(s): CKTOTAL, CKMB, CKMBINDEX, TROPONINI in the last 72 hours. BNP: Invalid input(s): POCBNP D-Dimer: No results for input(s): DDIMER in the last 72 hours. Hemoglobin A1C:  Recent Labs  08/21/15 0947  HGBA1C 5.8*   Fasting Lipid Panel:  Recent Labs  08/21/15 0947  CHOL 243*  HDL 45  LDLCALC 157*  TRIG 206*  CHOLHDL 5.4   Thyroid Function Tests:  Recent Labs  08/21/15 0947  TSH 0.729   Anemia Panel:  Recent Labs  08/21/15 0947  VITAMINB12 241   Coag Panel:    Lab Results  Component Value Date   INR 0.99 08/20/2015   INR 0.92 06/11/2014    RADIOLOGY: Ct Angio Head W/cm &/or Wo Cm  08/20/2015  ADDENDUM REPORT: 08/20/2015 07:10 ADDENDUM: Additional observations with this study: The acute left thalamic parenchymal hemorrhage is not significantly changed in size relative to previous exam from earlier today, measuring 19 x 32 x 32 mm (estimated volume 9.7 cc). Similar localized vasogenic edema and mass effect. Intraventricular extension with the preponderance of intraventricular blood within the left lateral ventricle is similar, although smaller amounts of blood are present within the right lateral ventricle, third ventricle, with subsequent extension through the cerebral aqueduct into the fourth ventricle, stable. Localized left-to-right shift of approximately 5 mm and ventricular dilatation is also stable. Electronically Signed   By: Rise MuBenjamin  McClintock M.D.   On: 08/20/2015 07:10  08/20/2015  CLINICAL DATA:  Initial valuation for acute intracranial hemorrhage. EXAM: CT ANGIOGRAPHY HEAD TECHNIQUE: Multidetector CT imaging of the head was performed using the standard protocol during bolus administration of intravenous contrast. Multiplanar CT image reconstructions and MIPs were obtained to evaluate the vascular anatomy. CONTRAST:  100 cc of Isovue  370. COMPARISON:  Prior CT from earlier the same day as well as prior MRA from 06/11/2014. FINDINGS: CTA HEAD Anterior circulation: Visualized distal cervical segments of the internal carotid arteries are widely patent. Petrous, cavernous, and supraclinoid segments widely patent without flow-limiting stenosis. Minimal focal plaque within the cavernous left ICA noted. A1 segments, anterior communicating artery common anterior cerebral arteries well opacified. M1 segments widely patent without stenosis or occlusion. MCA bifurcations normal. Distal MCA branches well opacified and symmetric. Posterior circulation: Vertebral  arteries patent to the vertebrobasilar junction. Posterior inferior cerebral arteries patent. Basilar artery widely patent. Superior cerebellar and posterior cerebral arteries well opacified bilaterally. Venous sinuses: Patent without evidence for venous sinus thrombosis. Anatomic variants: No anatomic variant. No aneurysm or vascular malformation. Delayed phase:No pathologic enhancement. IMPRESSION: 1. Mild atheromatous irregularity within the cavernous ICAs bilaterally without significant stenosis. 2. Otherwise negative CTA of the neck. No high-grade or correctable stenosis. No large vessel occlusion. No aneurysm or vascular malformation. Electronically Signed: By: Rise MuBenjamin  McClintock M.D. On: 08/20/2015 05:00   Ct Head Wo Contrast  08/22/2015  CLINICAL DATA:  Follow-up intracranial hemorrhage. EXAM: CT HEAD WITHOUT CONTRAST TECHNIQUE: Contiguous axial images were obtained from the base of the skull through the vertex without intravenous contrast. COMPARISON:  CT HEAD August 21, 2015 FINDINGS: INTRACRANIAL CONTENTS: Evolving 33 x 19 mm LEFT thalamic to corona radiata intraparenchymal hematoma with surrounding low-density vasogenic edema. Intraventricular extension of hemorrhage, with LEFT greater than RIGHT lateral ventricle blood products, blood products in the third and fourth ventricles similar to prior CT. 5 mm LEFT-to-RIGHT midline shift. Mild similar ventriculomegaly with periventricular white matter hypodensities. No acute large vascular territory infarct. Mildly effaced basal cisterns. ORBITS: The included ocular globes and orbital contents are normal. SINUSES: Minimal LEFT anterior ethmoid mucosal thickening. Mastoid air cells are well aerated. SKULL/SOFT TISSUES: No skull fracture. No significant soft tissue swelling. IMPRESSION: Evolving LEFT thalamus hematoma with intraventricular extension. Stable mild hydrocephalus and interstitial edema. Stable 5 mm LEFT-to-RIGHT midline shift. Electronically  Signed   By: Awilda Metroourtnay  Bloomer M.D.   On: 08/22/2015 02:06   Ct Head Wo Contrast  08/21/2015  CLINICAL DATA:  Follow-up intracranial hemorrhage. History of hypertension. EXAM: CT HEAD WITHOUT CONTRAST TECHNIQUE: Contiguous axial images were obtained from the base of the skull through the vertex without intravenous contrast. COMPARISON:  CT HEAD August 20, 2015 FINDINGS: INTRACRANIAL CONTENTS: Evolving 3.2 x 2.2 cm relatively stable LEFT thalamus to corona radiata intraparenchymal hematoma with surrounding low-density vasogenic edema. Intraventricular extension, with partially effaced third ventricle, stable 5 mm LEFT-to-RIGHT midline shift. Confluent periventricular white matter hypodensities with mild dilatation RIGHT lateral ventricle. Mild sulcal effacement of the convexities. Mild basilar cistern effacement. Minimal calcific atherosclerosis of the carotid bulbs. ORBITS: The included ocular globes and orbital contents are normal. SINUSES: The mastoid aircells and included paranasal sinuses are well-aerated. SKULL/SOFT TISSUES: No skull fracture. No significant soft tissue swelling. IMPRESSION: Evolving LEFT thalamus hematoma with intraventricular extension. Mild hydrocephalus and periventricular hypodensity/interstitial edema with mild RIGHT ventricular entrapment. Stable 5 mm LEFT-to-RIGHT midline shift. Electronically Signed   By: Awilda Metroourtnay  Bloomer M.D.   On: 08/21/2015 04:05   Ct Head Wo Contrast  08/20/2015  CLINICAL DATA:  50 year old male with intoxication with alcohol. History of hypertension. EXAM: CT HEAD WITHOUT CONTRAST TECHNIQUE: Contiguous axial images were obtained from the base of the skull through the vertex without intravenous contrast. COMPARISON:  Head CT dated 06/12/2014 FINDINGS: There is a 3.4 x 2.3 cm left thalamic hemorrhage with  intraventricular extension. Blood fills the left lateral ventricle, third ventricle as well as the fourth ventricle. There is mass effect and approximately 5  mm left-to-right midline shift. There is mild periventricular and deep white matter chronic microvascular ischemic changes, advanced for the patient's age. The visualized paranasal sinuses and mastoid air cells are clear. The calvarium is intact. IMPRESSION: Left thalamic hemorrhage with extension into the intraventricular system and approximately 5 mm left-to-right midline shift. These results were called by telephone at the time of interpretation on 08/20/2015 at 2:55 am to Dr. Melene Plan , who verbally acknowledged these results. Electronically Signed   By: Elgie Collard M.D.   On: 08/20/2015 02:59   Dg Chest Port 1 View  08/21/2015  CLINICAL DATA:  Hypertension for 2 days. EXAM: PORTABLE CHEST 1 VIEW COMPARISON:  None. FINDINGS: The heart is enlarged. There is mild tortuosity of the thoracic aorta. The pulmonary hila appear normal. The lungs are clear. No pleural effusion. The bony thorax is intact. IMPRESSION: Mild cardiac enlargement.  No acute pulmonary findings. Electronically Signed   By: Rudie Meyer M.D.   On: 08/21/2015 20:56   Dg Abd Portable 1v  08/22/2015  CLINICAL DATA:  Feeding tube placement EXAM: PORTABLE ABDOMEN - 1 VIEW COMPARISON:  None. FINDINGS: Feeding tube tip is in the descending duodenum. Nonobstructive bowel gas pattern. Visualized lung bases clear. IMPRESSION: Feeding tube tip in the distal second portion of the duodenum. Electronically Signed   By: Charlett Nose M.D.   On: 08/22/2015 16:07      ASSESSMENT: Roosvelt Harps:    1) HTN: Improved.  Weaning IV meds.  Continue metoprolol.  This should help reduce any NSVT.  No further cardiac w/u at this time.  Known mildly decreased LVEF.    Corky Crafts, MD  08/23/2015  9:25 AM

## 2015-08-23 NOTE — Progress Notes (Addendum)
STROKE TEAM PROGRESS NOTE   SUBJECTIVE (INTERVAL HISTORY) No family at bedside. PT currently working with him. Neuro improved some, inconsistently follow a few midline commands. Remains on BP drip. Add hydralazine. Wean cleviprex drip as able.    OBJECTIVE Temp:  [98.3 F (36.8 C)-100.8 F (38.2 C)] 99.2 F (37.3 C) (06/22 0800) Pulse Rate:  [71-100] 93 (06/22 0930) Cardiac Rhythm:  [-] Normal sinus rhythm (06/22 0755) Resp:  [13-26] 20 (06/22 0930) BP: (116-160)/(78-110) 148/89 mmHg (06/22 0930) SpO2:  [95 %-100 %] 96 % (06/22 0930) Weight:  [70.4 kg (155 lb 3.3 oz)] 70.4 kg (155 lb 3.3 oz) (06/22 0500)  CBC:   Recent Labs Lab 08/20/15 0205  08/22/15 0320 08/23/15 0411  WBC 10.9*  < > 9.3 9.0  NEUTROABS 9.4*  --   --   --   HGB 13.6  < > 15.5 14.4  HCT 39.5  < > 44.7 41.8  MCV 86.6  < > 86.0 86.0  PLT 333  < > 333 305  < > = values in this interval not displayed.  Basic Metabolic Panel:   Recent Labs Lab 08/22/15 1308 08/23/15 0411  NA 134* 133*  K 3.5 3.8  CL 101 103  CO2 23 22  GLUCOSE 118* 112*  BUN 18 22*  CREATININE 1.26* 1.30*  CALCIUM 8.9 8.6*  MG 2.1  --     Lipid Panel:     Component Value Date/Time   CHOL 243* 08/21/2015 0947   TRIG 206* 08/21/2015 0947   HDL 45 08/21/2015 0947   CHOLHDL 5.4 08/21/2015 0947   VLDL 41* 08/21/2015 0947   LDLCALC 157* 08/21/2015 0947   HgbA1c:  Lab Results  Component Value Date   HGBA1C 5.8* 08/21/2015   Urine Drug Screen:     Component Value Date/Time   LABOPIA NONE DETECTED 08/21/2015 0658   COCAINSCRNUR NONE DETECTED 08/21/2015 0658   LABBENZ NONE DETECTED 08/21/2015 0658   AMPHETMU NONE DETECTED 08/21/2015 0658   THCU NONE DETECTED 08/21/2015 0658   LABBARB NONE DETECTED 08/21/2015 0658      IMAGING  Ct Angio Head W/cm &/or Wo Cm 08/20/2015  ADDENDUM: Additional observations with this study: The acute left thalamic parenchymal hemorrhage is not significantly changed in size relative to  previous exam from earlier today, measuring 19 x 32 x 32 mm (estimated volume 9.7 cc). Similar localized vasogenic edema and mass effect. Intraventricular extension with the preponderance of intraventricular blood within the left lateral ventricle is similar, although smaller amounts of blood are present within the right lateral ventricle, third ventricle, with subsequent extension through the cerebral aqueduct into the fourth ventricle, stable. Localized left-to-right shift of approximately 5 mm and ventricular dilatation is also 1. Mild atheromatous irregularity within the cavernous ICAs bilaterally without significant stenosis. 2. Otherwise negative CTA of the neck. No high-grade or correctable stenosis. No large vessel occlusion. No aneurysm or vascular malformation.   Ct Head Wo Contrast 08/22/2015 Evolving LEFT thalamus hematoma with intraventricular extension. Stable mild hydrocephalus and interstitial edema. Stable 5 mm LEFT-to-RIGHT midline shift.  08/21/2015   Evolving LEFT thalamus hematoma with intraventricular extension. Mild hydrocephalus and periventricular hypodensity/interstitial edema with mild RIGHT ventricular entrapment. Stable 5 mm LEFT-to-RIGHT midline shift.   08/20/2015   Left thalamic hemorrhage with extension into the intraventricular system and approximately 5 mm left-to-right midline shift.   Carotid Doppler   There is 1-39% bilateral ICA stenosis. Vertebral artery flow is antegrade.   TTE - Left ventricle: The cavity  size was normal. Wall thickness was normal. Systolic function was normal. The estimated ejection fraction was in the range of 60% to 65%. Wall motion was normal; there were no regional wall motion abnormalities. Left ventricular diastolic function parameters were normal. - Left atrium: Shadowing artifact on PSL axis images. - Atrial septum: No defect or patent foramen ovale was identified. Impressions:   No cardiac source of emboli was  indentified.   PHYSICAL EXAM General - Well nourished, well developed, in no apparent distress.  Ophthalmologic - Fundi not visualized due to noncooperation.  Cardiovascular - Regular rate and rhythm.  Neuro - awake, not alert, global aphasia, no speech output, but seems able to follow a couple of midline commands, but not peripheral commands. Right neglect, left gaze preference, but able to cross midline. Blinking occasionally to visual threat on the right, but not to the left. PERRL. Right facial droop, tongue in middle in mouth. Right hemiplegia, trace withdraw on right UE and 3-/5 RLE with pain stimulation. LUE and LLE spontaneous movement. Sensation, coordination and gait not tested.    ASSESSMENT/PLAN [name] is a 50 y.o. male with history of HTN found down in the bathroom. CT showed large left thalamic ICH.   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 No high grade stenosis  Repeat CT head remains stable  Carotid Doppler  No significant stenosis   2D Echo  EF 60-65%  LDL 157  HgbA1c 5.8  SCDs for VTE prophylaxis  Add amantadine for 7 days to improve arousal.  Diet NPO time specified  No antithrombotic prior to admission  Ongoing aggressive stroke risk factor management  Therapy recommendations:  CIR. Admissions coordinator following  Disposition:  pending   Transient V-tach  Likely associated with ICH  EF 60-65%  Cardiology on board, appreciate recs  continue metoprolol  Hypertensive Emergency  BP as high as 215/120, 213/130 on arrival in setting of neuro changes  Remains On cleviprex drip  SBP goal < 160  home PO meds resumed   Add hydralazine 50mg  Q8  Decrease IVF after TF  increased  Continue to Wean off cleviprex as able  Secondary HTN work up so far negative.   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 Hart RochesterLawson, recommended a follow up as an outpatient in 4 weeks with a CT angiogram of abdomin and pelvis and to discuss further treatment.   Per EPIC, there has been no follow up  Will order CTA abd/pelvis once Cre normalized - Cre 1.38->1.26->1.30  Other Active Problems  Hypokalemia 3.3, replaced with 4 runs  Elevated creatinine 1.38  Leukocytosis,  10.9  Hyponatremia 133, placed on 1 gm tid yesterday, increase to 2 gm tid today  Hospital day # 3  [facility]oderick [facility] See Amion for Pager information 08/23/2015 10:12 AM   This patient is critically ill due to ICH with IVH, hydrocephalus, hypertensive emergency, dysphagia and at significant risk of neurological worsening, death form hematoma expansion, obstructive hydrocephalus, heart failure, cerebral edema and brain herniation. This patient's care requires constant monitoring of vital signs, hemodynamics, respiratory and cardiac monitoring, review of multiple databases, neurological assessment, discussion with family, other specialists and medical decision making of high complexity. I spent 35 minutes of neurocritical care time in the care of this patient.  Pt neuro improved, able to cross midline on the right now. LLE improved to 3-/5 on  pain stimulation. able to follow a couple of midline commands. Still global aphaia with right neglect. on TF. Still on cleviprex. Resume home meds. Add hydralazine, wean off cleviprex as able. Malignant HTN work up so far negative. Add amantadine for arousal.   Marvel Plan, MD PhD Stroke Neurology 08/23/2015 7:09 PM   To contact Stroke Continuity provider, please refer to [email or web address]. After hours, contact General Neurology

## 2015-08-23 NOTE — Progress Notes (Signed)
[facility].   Left message on voicemail.   Quintella BatonJulie W. Jaquisha Frech, RN, BSN  Trauma/Neuro ICU Case Manager [phone]

## 2015-08-24 ENCOUNTER — Inpatient Hospital Stay (HOSPITAL_COMMUNITY): Payer: Non-veteran care

## 2015-08-24 DIAGNOSIS — G8100 Flaccid hemiplegia affecting unspecified side: Secondary | ICD-10-CM | POA: Diagnosis not present

## 2015-08-24 DIAGNOSIS — I69391 Dysphagia following cerebral infarction: Secondary | ICD-10-CM | POA: Diagnosis not present

## 2015-08-24 DIAGNOSIS — I472 Ventricular tachycardia: Secondary | ICD-10-CM | POA: Diagnosis not present

## 2015-08-24 DIAGNOSIS — I610 Nontraumatic intracerebral hemorrhage in hemisphere, subcortical: Principal | ICD-10-CM

## 2015-08-24 DIAGNOSIS — I161 Hypertensive emergency: Secondary | ICD-10-CM

## 2015-08-24 LAB — CBC
HCT: 43.3 % (ref 39.0–52.0)
HEMOGLOBIN: 14.6 g/dL (ref 13.0–17.0)
MCH: 29 pg (ref 26.0–34.0)
MCHC: 33.7 g/dL (ref 30.0–36.0)
MCV: 85.9 fL (ref 78.0–100.0)
PLATELETS: 298 10*3/uL (ref 150–400)
RBC: 5.04 MIL/uL (ref 4.22–5.81)
RDW: 13.3 % (ref 11.5–15.5)
WBC: 9.8 10*3/uL (ref 4.0–10.5)

## 2015-08-24 LAB — BASIC METABOLIC PANEL
Anion gap: 8 (ref 5–15)
BUN: 24 mg/dL — AB (ref 6–20)
CALCIUM: 8.8 mg/dL — AB (ref 8.9–10.3)
CHLORIDE: 105 mmol/L (ref 101–111)
CO2: 22 mmol/L (ref 22–32)
CREATININE: 1.31 mg/dL — AB (ref 0.61–1.24)
Glucose, Bld: 130 mg/dL — ABNORMAL HIGH (ref 65–99)
Potassium: 3.7 mmol/L (ref 3.5–5.1)
SODIUM: 135 mmol/L (ref 135–145)

## 2015-08-24 LAB — URINALYSIS W MICROSCOPIC (NOT AT ARMC)
Bilirubin Urine: NEGATIVE
Glucose, UA: NEGATIVE mg/dL
Ketones, ur: NEGATIVE mg/dL
Leukocytes, UA: NEGATIVE
NITRITE: NEGATIVE
PH: 6.5 (ref 5.0–8.0)
Protein, ur: 30 mg/dL — AB
SPECIFIC GRAVITY, URINE: 1.014 (ref 1.005–1.030)

## 2015-08-24 LAB — GLUCOSE, CAPILLARY
GLUCOSE-CAPILLARY: 109 mg/dL — AB (ref 65–99)
GLUCOSE-CAPILLARY: 120 mg/dL — AB (ref 65–99)
GLUCOSE-CAPILLARY: 87 mg/dL (ref 65–99)
GLUCOSE-CAPILLARY: 89 mg/dL (ref 65–99)
Glucose-Capillary: 117 mg/dL — ABNORMAL HIGH (ref 65–99)
Glucose-Capillary: 138 mg/dL — ABNORMAL HIGH (ref 65–99)

## 2015-08-24 MED ORDER — ATORVASTATIN CALCIUM 10 MG PO TABS
20.0000 mg | ORAL_TABLET | Freq: Every day | ORAL | Status: DC
  Administered 2015-08-24 – 2015-08-30 (×7): 20 mg via ORAL
  Filled 2015-08-24 (×7): qty 2

## 2015-08-24 MED ORDER — PANTOPRAZOLE SODIUM 40 MG PO TBEC: 40.0000 mg | DELAYED_RELEASE_TABLET | Freq: Every day | ORAL | Status: DC

## 2015-08-24 MED ORDER — METOPROLOL TARTRATE 50 MG PO TABS
75.0000 mg | ORAL_TABLET | Freq: Two times a day (BID) | ORAL | Status: DC
  Administered 2015-08-24 – 2015-08-27 (×7): 75 mg via ORAL
  Filled 2015-08-24 (×7): qty 1

## 2015-08-24 MED ORDER — HEPARIN SODIUM (PORCINE) 5000 UNIT/ML IJ SOLN
5000.0000 [IU] | Freq: Three times a day (TID) | INTRAMUSCULAR | Status: DC
  Administered 2015-08-24 – 2015-08-31 (×20): 5000 [IU] via SUBCUTANEOUS
  Filled 2015-08-24 (×21): qty 1

## 2015-08-24 NOTE — Progress Notes (Signed)
Patient transferred to 5M12 from 67M. Safety precautions and orders reviewed with patient and will reinforce. TELE applied and confirmed. Pt denied any distress at this time. Awaiting for feeding pump to restart feeding at this time. SCDs stocking ordered and replaced. Will continue to monitor.  Sim BoastHavy, RN

## 2015-08-24 NOTE — Progress Notes (Signed)
I continue to follow progress. I have notified RN Cm and SW that SNF also needs to be pursued at this time depending on bed availability for CIR and progress. [phone]

## 2015-08-24 NOTE — Progress Notes (Signed)
STROKE TEAM PROGRESS NOTE   SUBJECTIVE (INTERVAL HISTORY) No family at bedside. His RN is at bedside. Pt still on cleviprex. Will relax the BP goal to < 180 as he has been 4 days after ICH. Repeat CT to rule out hydrocephalus as he is more sleepy than yesterday. Increase BP meds for better BP control. Pt had fever and will need blood culture and UA with CXR.    OBJECTIVE Temp:  [97.7 F (36.5 C)-101.1 F (38.4 C)] 101.1 F (38.4 C) (06/23 0732) Pulse Rate:  [72-110] 95 (06/23 0900) Cardiac Rhythm:  [-] Normal sinus rhythm (06/23 0800) Resp:  [11-28] 19 (06/23 0900) BP: (131-174)/(81-105) 153/84 mmHg (06/23 0900) SpO2:  [93 %-100 %] 97 % (06/23 0900) Weight:  [68.3 kg (150 lb 9.2 oz)] 68.3 kg (150 lb 9.2 oz) (06/23 0600)  CBC:   Recent Labs Lab 08/20/15 0205  08/23/15 0411 08/24/15 0350  WBC 10.9*  < > 9.0 9.8  NEUTROABS 9.4*  --   --   --   HGB 13.6  < > 14.4 14.6  HCT 39.5  < > 41.8 43.3  MCV 86.6  < > 86.0 85.9  PLT 333  < > 305 298  < > = values in this interval not displayed.  Basic Metabolic Panel:   Recent Labs Lab 08/22/15 1308 08/23/15 0411 08/24/15 0350  NA 134* 133* 135  K 3.5 3.8 3.7  CL 101 103 105  CO2 GLUCOSE 118* 112* 130*  BUN 18 22* 24*  CREATININE 1.26* 1.30* 1.31*  CALCIUM 8.9 8.6* 8.8*  MG 2.1  --   --     Lipid Panel:     Component Value Date/Time   CHOL 243* 08/21/2015 0947   TRIG 206* 08/21/2015 0947   HDL 45 08/21/2015 0947   CHOLHDL 5.4 08/21/2015 0947   VLDL 41* 08/21/2015 0947   LDLCALC 157* 08/21/2015 0947   HgbA1c:  Lab Results  Component Value Date   HGBA1C 5.8* 08/21/2015   Urine Drug Screen:     Component Value Date/Time   LABOPIA NONE DETECTED 08/21/2015 0658   COCAINSCRNUR NONE DETECTED 08/21/2015 0658   LABBENZ NONE DETECTED 08/21/2015 0658   AMPHETMU NONE DETECTED 08/21/2015 0658   THCU NONE DETECTED 08/21/2015 0658   LABBARB NONE DETECTED 08/21/2015 0658      IMAGING I have personally  reviewed the radiological images below and agree with the radiology interpretations.  Ct Angio Head W/cm &/or Wo Cm 08/20/2015  ADDENDUM: Additional observations with this study: The acute left thalamic parenchymal hemorrhage is not significantly changed in size relative to previous exam from earlier today, measuring 19 x 32 x 32 mm (estimated volume 9.7 cc). Similar localized vasogenic edema and mass effect. Intraventricular extension with the preponderance of intraventricular blood within the left lateral ventricle is similar, although smaller amounts of blood are present within the right lateral ventricle, third ventricle, with subsequent extension through the cerebral aqueduct into the fourth ventricle, stable. Localized left-to-right shift of approximately 5 mm and ventricular dilatation is also 1. Mild atheromatous irregularity within the cavernous ICAs bilaterally without significant stenosis. 2. Otherwise negative CTA of the neck. No high-grade or correctable stenosis. No large vessel occlusion. No aneurysm or vascular malformation.   Ct Head Wo Contrast 08/22/2015 Evolving LEFT thalamus hematoma with intraventricular extension. Stable mild hydrocephalus and interstitial edema. Stable 5 mm LEFT-to-RIGHT midline shift.  08/21/2015   Evolving LEFT thalamus hematoma with intraventricular extension. Mild hydrocephalus and periventricular  hypodensity/interstitial edema with mild RIGHT ventricular entrapment. Stable 5 mm LEFT-to-RIGHT midline shift.   08/20/2015   Left thalamic hemorrhage with extension into the intraventricular system and approximately 5 mm left-to-right midline shift.   Carotid Doppler   There is 1-39% bilateral ICA stenosis. Vertebral artery flow is antegrade.   TTE - Left ventricle: The cavity size was normal. Wall thickness was normal. Systolic function was normal. The estimated ejection fraction was in the range of 60% to 65%. Wall motion was normal; there were no regional wall  motion abnormalities. Left ventricular diastolic function parameters were normal. - Left atrium: Shadowing artifact on PSL axis images. - Atrial septum: No defect or patent foramen ovale was identified. Impressions:   No cardiac source of emboli was indentified.   PHYSICAL EXAM General - Well nourished, well developed, in no apparent distress.  Ophthalmologic - Fundi not visualized due to noncooperation.  Cardiovascular - Regular rate and rhythm.  Neuro - sleepy, not alert, global aphasia, no speech output, did not follow commands. Right neglect, left gaze preference, but able to cross midline. Blinking occasionally to visual threat on the right, but not to the left. PERRL. Right facial droop, tongue in middle in mouth. Right hemiplegia, trace withdraw on right UE and 3-/5 RLE with pain stimulation. LUE and LLE spontaneous movement. Sensation, coordination and gait not tested.    ASSESSMENT/PLAN [name] is a 50 y.o. male with history of HTN found down in the bathroom. CT showed large left thalamic ICH.   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 No high grade stenosis  Repeat CT head remains stable  Carotid Doppler  No significant stenosis   2D Echo  EF 60-65%  LDL 157  HgbA1c 5.8  SCDs and subq heparin for VTE prophylaxis  Add amantadine for 7 days to improve arousal.  Diet NPO time specified  No antithrombotic prior to admission  Ongoing aggressive stroke risk factor management  Therapy recommendations:  CIR. Admissions coordinator following  Disposition:  pending   Fever  Tmax 101.4 but WBC 9.8  Could be due to aspiration   CXR pending  UA pending  BCx pending  Hold off Abx for  now  Transient V-tach  Likely associated with ICH  EF 60-65%  Cardiology on board, appreciate recs  continue metoprolol  Hypertensive Emergency  BP as high as 215/120, 213/130 on arrival in setting of neuro changes  Remains On cleviprex drip  SBP goal < 160  home PO meds resumed - increase metoprolol to 75mg  bid, continue amlodipine 10mg  Qd and lisinopril 20mg  bid  Add hydralazine 50mg  Q8  Decrease IVF after TF increased  Continue to Wean off cleviprex as able  Secondary HTN work up so far negative.   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 Hart RochesterLawson, recommended a follow up as an outpatient in 4 weeks with a CT angiogram of abdomin and pelvis and to discuss further treatment.   Per EPIC, there has been no follow up  Will order CTA abd/pelvis once Cre normalized and stabilized - Cre 1.38->1.26->1.30->1.31  Other Active Problems  Hypokalemia 3.3 - supplement  Hospital day # 4  This patient is critically ill due to ICH with IVH, hydrocephalus, hypertensive emergency, dysphagia and at significant risk of neurological worsening, death form hematoma expansion, obstructive hydrocephalus, heart failure, cerebral edema and brain herniation. This patient's care requires constant monitoring of vital signs, hemodynamics, respiratory and cardiac monitoring, review of multiple  databases, neurological assessment, discussion with family, other specialists and medical decision making of high complexity. I spent 35 minutes of neurocritical care time in the care of this patient.  Marvel PlanJindong Jammal Sarr, MD PhD Stroke Neurology 08/24/2015 2:20 PM   To contact Stroke Continuity provider, please refer to [email or web address]. After hours, contact General Neurology

## 2015-08-24 NOTE — Progress Notes (Signed)
MBSS complete. Full report located under chart review in imaging section.  Khadijah Mastrianni Paiewonsky, M.A. CCC-SLP [phone]  

## 2015-08-24 NOTE — Progress Notes (Signed)
SUBJECTIVE:  No further arrhythmia per the nurse.  BP improved.  OBJECTIVE:   Vitals:   Filed Vitals:   08/24/15 0800 08/24/15 0900 08/24/15 1000 08/24/15 1112  BP: 143/81 153/84 160/98   Pulse: 87 95 104   Temp:    100.5 F (38.1 C)  TempSrc:    Axillary  Resp: 20 19 25    Height:      Weight:      SpO2: 98% 97% 97%    I&O's:    Intake/Output Summary (Last 24 hours) at 08/24/15 1152 Last data filed at 08/24/15 1000  Gross per 24 hour  Intake 3843.13 ml  Output   1985 ml  Net 1858.13 ml   TELEMETRY: Reviewed telemetry pt in *NSR, no further NSVT:     PHYSICAL EXAM General: Leftward preference Head:   Normal cephalic and atramatic  Lungs:  No wheezing bilaterally to auscultation. Heart:   HRRR S1 S2  No JVD.   Abdomen: abdomen soft and non-tender  Extremities:   No edema.   Neuro: sleeping Psych:  Sleeping, aphasic by history Skin: No rash   LABS: Basic Metabolic Panel:  Recent Labs  96/06/5404/21/17 1308 08/23/15 0411 08/24/15 0350  NA 134* 133* 135  K 3.5 3.8 3.7  CL 101 103 105  CO2 23 22 22   GLUCOSE 118* 112* 130*  BUN 18 22* 24*  CREATININE 1.26* 1.30* 1.31*  CALCIUM 8.9 8.6* 8.8*  MG 2.1  --   --    Liver Function Tests: No results for input(s): AST, ALT, ALKPHOS, BILITOT, PROT, ALBUMIN in the last 72 hours. No results for input(s): LIPASE, AMYLASE in the last 72 hours. CBC:  Recent Labs  08/23/15 0411 08/24/15 0350  WBC 9.0 9.8  HGB 14.4 14.6  HCT 41.8 43.3  MCV 86.0 85.9  PLT 305 298   Cardiac Enzymes: No results for input(s): CKTOTAL, CKMB, CKMBINDEX, TROPONINI in the last 72 hours. BNP: Invalid input(s): POCBNP D-Dimer: No results for input(s): DDIMER in the last 72 hours. Hemoglobin A1C: No results for input(s): HGBA1C in the last 72 hours. Fasting Lipid Panel: No results for input(s): CHOL, HDL, LDLCALC, TRIG, CHOLHDL, LDLDIRECT in the last 72 hours. Thyroid Function Tests: No results for input(s): TSH, T4TOTAL, T3FREE,  THYROIDAB in the last 72 hours.  Invalid input(s): FREET3 Anemia Panel: No results for input(s): VITAMINB12, FOLATE, FERRITIN, TIBC, IRON, RETICCTPCT in the last 72 hours. Coag Panel:   Lab Results  Component Value Date   INR 0.99 08/20/2015   INR 0.92 06/11/2014    RADIOLOGY: Ct Angio Head W/cm &/or Wo Cm  08/20/2015  ADDENDUM REPORT: 08/20/2015 07:10 ADDENDUM: Additional observations with this study: The acute left thalamic parenchymal hemorrhage is not significantly changed in size relative to previous exam from earlier today, measuring 19 x 32 x 32 mm (estimated volume 9.7 cc). Similar localized vasogenic edema and mass effect. Intraventricular extension with the preponderance of intraventricular blood within the left lateral ventricle is similar, although smaller amounts of blood are present within the right lateral ventricle, third ventricle, with subsequent extension through the cerebral aqueduct into the fourth ventricle, stable. Localized left-to-right shift of approximately 5 mm and ventricular dilatation is also stable. Electronically Signed   By: Rise MuBenjamin  McClintock M.D.   On: 08/20/2015 07:10  08/20/2015  CLINICAL DATA:  Initial valuation for acute intracranial hemorrhage. EXAM: CT ANGIOGRAPHY HEAD TECHNIQUE: Multidetector CT imaging of the head was performed using the standard protocol during bolus administration of intravenous contrast.  Multiplanar CT image reconstructions and MIPs were obtained to evaluate the vascular anatomy. CONTRAST:  100 cc of Isovue 370. COMPARISON:  Prior CT from earlier the same day as well as prior MRA from 06/11/2014. FINDINGS: CTA HEAD Anterior circulation: Visualized distal cervical segments of the internal carotid arteries are widely patent. Petrous, cavernous, and supraclinoid segments widely patent without flow-limiting stenosis. Minimal focal plaque within the cavernous left ICA noted. A1 segments, anterior communicating artery common anterior cerebral  arteries well opacified. M1 segments widely patent without stenosis or occlusion. MCA bifurcations normal. Distal MCA branches well opacified and symmetric. Posterior circulation: Vertebral arteries patent to the vertebrobasilar junction. Posterior inferior cerebral arteries patent. Basilar artery widely patent. Superior cerebellar and posterior cerebral arteries well opacified bilaterally. Venous sinuses: Patent without evidence for venous sinus thrombosis. Anatomic variants: No anatomic variant. No aneurysm or vascular malformation. Delayed phase:No pathologic enhancement. IMPRESSION: 1. Mild atheromatous irregularity within the cavernous ICAs bilaterally without significant stenosis. 2. Otherwise negative CTA of the neck. No high-grade or correctable stenosis. No large vessel occlusion. No aneurysm or vascular malformation. Electronically Signed: By: Rise MuBenjamin  McClintock M.D. On: 08/20/2015 05:00   Ct Head Wo Contrast  08/22/2015  CLINICAL DATA:  Follow-up intracranial hemorrhage. EXAM: CT HEAD WITHOUT CONTRAST TECHNIQUE: Contiguous axial images were obtained from the base of the skull through the vertex without intravenous contrast. COMPARISON:  CT HEAD August 21, 2015 FINDINGS: INTRACRANIAL CONTENTS: Evolving 33 x 19 mm LEFT thalamic to corona radiata intraparenchymal hematoma with surrounding low-density vasogenic edema. Intraventricular extension of hemorrhage, with LEFT greater than RIGHT lateral ventricle blood products, blood products in the third and fourth ventricles similar to prior CT. 5 mm LEFT-to-RIGHT midline shift. Mild similar ventriculomegaly with periventricular white matter hypodensities. No acute large vascular territory infarct. Mildly effaced basal cisterns. ORBITS: The included ocular globes and orbital contents are normal. SINUSES: Minimal LEFT anterior ethmoid mucosal thickening. Mastoid air cells are well aerated. SKULL/SOFT TISSUES: No skull fracture. No significant soft tissue  swelling. IMPRESSION: Evolving LEFT thalamus hematoma with intraventricular extension. Stable mild hydrocephalus and interstitial edema. Stable 5 mm LEFT-to-RIGHT midline shift. Electronically Signed   By: Awilda Metroourtnay  Bloomer M.D.   On: 08/22/2015 02:06   Ct Head Wo Contrast  08/21/2015  CLINICAL DATA:  Follow-up intracranial hemorrhage. History of hypertension. EXAM: CT HEAD WITHOUT CONTRAST TECHNIQUE: Contiguous axial images were obtained from the base of the skull through the vertex without intravenous contrast. COMPARISON:  CT HEAD August 20, 2015 FINDINGS: INTRACRANIAL CONTENTS: Evolving 3.2 x 2.2 cm relatively stable LEFT thalamus to corona radiata intraparenchymal hematoma with surrounding low-density vasogenic edema. Intraventricular extension, with partially effaced third ventricle, stable 5 mm LEFT-to-RIGHT midline shift. Confluent periventricular white matter hypodensities with mild dilatation RIGHT lateral ventricle. Mild sulcal effacement of the convexities. Mild basilar cistern effacement. Minimal calcific atherosclerosis of the carotid bulbs. ORBITS: The included ocular globes and orbital contents are normal. SINUSES: The mastoid aircells and included paranasal sinuses are well-aerated. SKULL/SOFT TISSUES: No skull fracture. No significant soft tissue swelling. IMPRESSION: Evolving LEFT thalamus hematoma with intraventricular extension. Mild hydrocephalus and periventricular hypodensity/interstitial edema with mild RIGHT ventricular entrapment. Stable 5 mm LEFT-to-RIGHT midline shift. Electronically Signed   By: Awilda Metroourtnay  Bloomer M.D.   On: 08/21/2015 04:05   Ct Head Wo Contrast  08/20/2015  CLINICAL DATA:  50 year old male with intoxication with alcohol. History of hypertension. EXAM: CT HEAD WITHOUT CONTRAST TECHNIQUE: Contiguous axial images were obtained from the base of the skull through the vertex without intravenous  contrast. COMPARISON:  Head CT dated 06/12/2014 FINDINGS: There is a 3.4 x  2.3 cm left thalamic hemorrhage with intraventricular extension. Blood fills the left lateral ventricle, third ventricle as well as the fourth ventricle. There is mass effect and approximately 5 mm left-to-right midline shift. There is mild periventricular and deep white matter chronic microvascular ischemic changes, advanced for the patient's age. The visualized paranasal sinuses and mastoid air cells are clear. The calvarium is intact. IMPRESSION: Left thalamic hemorrhage with extension into the intraventricular system and approximately 5 mm left-to-right midline shift. These results were called by telephone at the time of interpretation on 08/20/2015 at 2:55 am to Dr. Melene Plan , who verbally acknowledged these results. Electronically Signed   By: Elgie Collard M.D.   On: 08/20/2015 02:59   Dg Chest Port 1 View  08/21/2015  CLINICAL DATA:  Hypertension for 2 days. EXAM: PORTABLE CHEST 1 VIEW COMPARISON:  None. FINDINGS: The heart is enlarged. There is mild tortuosity of the thoracic aorta. The pulmonary hila appear normal. The lungs are clear. No pleural effusion. The bony thorax is intact. IMPRESSION: Mild cardiac enlargement.  No acute pulmonary findings. Electronically Signed   By: Rudie Meyer M.D.   On: 08/21/2015 20:56   Dg Abd Portable 1v  08/23/2015  CLINICAL DATA:  50 year old male with NG tube placement EXAM: PORTABLE ABDOMEN - 1 VIEW COMPARISON:  Abdominal radiograph dated 08/23/2015 FINDINGS: An enteric tube is noted extending into the right hemi abdomen with tip adjacent to the right transverse process of the L4, likely at the junction of the second and third portion of the duodenum. There is no bowel dilatation or evidence of obstruction. Moderate stool noted throughout the colon. No no free air. No radiopaque calculi. There is degenerative changes of the spine. No acute fracture. IMPRESSION: Enteric tube the tip likely in the duodenum. Electronically Signed   By: Elgie Collard M.D.    On: 08/23/2015 23:32   Dg Abd Portable 1v  08/23/2015  CLINICAL DATA:  Patient status post feeding tube placement. EXAM: PORTABLE ABDOMEN - 1 VIEW COMPARISON:  Abdominal radiograph 08/22/2015. FINDINGS: Weighted feeding tube tip projects over the left upper quadrant, likely within the stomach. Nonobstructed bowel gas pattern. Lung bases are clear. Regional skeleton unremarkable. IMPRESSION: Weighted feeding tube tip projects over the stomach. Electronically Signed   By: Annia Belt M.D.   On: 08/23/2015 19:20   Dg Abd Portable 1v  08/22/2015  CLINICAL DATA:  Feeding tube placement EXAM: PORTABLE ABDOMEN - 1 VIEW COMPARISON:  None. FINDINGS: Feeding tube tip is in the descending duodenum. Nonobstructive bowel gas pattern. Visualized lung bases clear. IMPRESSION: Feeding tube tip in the distal second portion of the duodenum. Electronically Signed   By: Charlett Nose M.D.   On: 08/22/2015 16:07      ASSESSMENT: Andre [name]:    1) HTN: Improved.  Weaning IV meds.  Continue metoprolol.  This should help reduce any NSVT.  No further cardiac w/u at this time.  Known mildly decreased LVEF.    Will sign off at this time.  Please call with any further questions.   Corky Crafts, MD  08/24/2015  11:52 AM

## 2015-08-25 DIAGNOSIS — I69322 Dysarthria following cerebral infarction: Secondary | ICD-10-CM

## 2015-08-25 DIAGNOSIS — I472 Ventricular tachycardia: Secondary | ICD-10-CM | POA: Diagnosis not present

## 2015-08-25 DIAGNOSIS — G8100 Flaccid hemiplegia affecting unspecified side: Secondary | ICD-10-CM | POA: Diagnosis not present

## 2015-08-25 DIAGNOSIS — I10 Essential (primary) hypertension: Secondary | ICD-10-CM | POA: Diagnosis not present

## 2015-08-25 DIAGNOSIS — I69391 Dysphagia following cerebral infarction: Secondary | ICD-10-CM | POA: Diagnosis not present

## 2015-08-25 DIAGNOSIS — I161 Hypertensive emergency: Secondary | ICD-10-CM | POA: Diagnosis not present

## 2015-08-25 DIAGNOSIS — I610 Nontraumatic intracerebral hemorrhage in hemisphere, subcortical: Secondary | ICD-10-CM | POA: Diagnosis not present

## 2015-08-25 LAB — CBC
HEMATOCRIT: 41.5 % (ref 39.0–52.0)
HEMOGLOBIN: 14.1 g/dL (ref 13.0–17.0)
MCH: 29.4 pg (ref 26.0–34.0)
MCHC: 34 g/dL (ref 30.0–36.0)
MCV: 86.5 fL (ref 78.0–100.0)
Platelets: 291 10*3/uL (ref 150–400)
RBC: 4.8 MIL/uL (ref 4.22–5.81)
RDW: 13.4 % (ref 11.5–15.5)
WBC: 9 10*3/uL (ref 4.0–10.5)

## 2015-08-25 LAB — GLUCOSE, CAPILLARY
GLUCOSE-CAPILLARY: 127 mg/dL — AB (ref 65–99)
GLUCOSE-CAPILLARY: 137 mg/dL — AB (ref 65–99)
Glucose-Capillary: 109 mg/dL — ABNORMAL HIGH (ref 65–99)
Glucose-Capillary: 135 mg/dL — ABNORMAL HIGH (ref 65–99)
Glucose-Capillary: 125 mg/dL — ABNORMAL HIGH (ref 65–99)
Glucose-Capillary: 151 mg/dL — ABNORMAL HIGH (ref 65–99)

## 2015-08-25 LAB — BASIC METABOLIC PANEL
ANION GAP: 10 (ref 5–15)
BUN: 28 mg/dL — ABNORMAL HIGH (ref 6–20)
CALCIUM: 9.4 mg/dL (ref 8.9–10.3)
CHLORIDE: 101 mmol/L (ref 101–111)
CO2: 23 mmol/L (ref 22–32)
CREATININE: 1.44 mg/dL — AB (ref 0.61–1.24)
GFR, EST NON AFRICAN AMERICAN: 56 mL/min — AB (ref >60)
Glucose, Bld: 125 mg/dL — ABNORMAL HIGH (ref 65–99)
Potassium: 3.5 mmol/L (ref 3.5–5.1)
Sodium: 134 mmol/L — ABNORMAL LOW (ref 135–145)

## 2015-08-25 MED ORDER — PANTOPRAZOLE SODIUM 40 MG PO PACK
40.0000 mg | PACK | Freq: Every day | ORAL | Status: DC
  Administered 2015-08-25 – 2015-08-31 (×7): 40 mg
  Filled 2015-08-25 (×6): qty 20

## 2015-08-25 NOTE — Progress Notes (Signed)
Patient ID: Andre [name], male   DOB: 12-13-1965, 50 y.o.   MRN: [id number]   SUBJECTIVE:  No complaints   OBJECTIVE:   Vitals:   Filed Vitals:   08/24/15 2133 08/24/15 2142 08/25/15 0129 08/25/15 0514  BP: 184/105 112/71 172/93 175/99  Pulse: 105 74 82 85  Temp: 99.6 F (37.6 C) 97.6 F (36.4 C) 100.2 F (37.9 C) 99.9 F (37.7 C)  TempSrc: Oral Oral Axillary Axillary  Resp: Height:      Weight:      SpO2: 98% 97% 99% 99%   I&O's:    Intake/Output Summary (Last 24 hours) at 08/25/15 0948 Last data filed at 08/24/15 1848  Gross per 24 hour  Intake  599.2 ml  Output    900 ml  Net -300.8 ml     PHYSICAL EXAM General: Leftward preference Head:   Normal cephalic and atramatic  Lungs:  No wheezing bilaterally to auscultation. Heart:   HRRR S1 S2  No JVD.   Abdomen: abdomen soft and non-tender  Extremities:   No edema.   Neuro: sleeping Psych:  Sleeping, aphasic by history Skin: No rash   LABS: Basic Metabolic Panel:  Recent Labs  40/98/11 1308  08/24/15 0350 08/25/15 0544  NA 134*  < > 135 134*  K 3.5  < > 3.7 3.5  CL 101  < > 105 101  CO2 23  < > 22 23  GLUCOSE 118*  < > 130* 125*  BUN 18  < > 24* 28*  CREATININE 1.26*  < > 1.31* 1.44*  CALCIUM 8.9  < > 8.8* 9.4  MG 2.1  --   --   --   < > = values in this interval not displayed. CBC:  Recent Labs  08/24/15 0350 08/25/15 0544  WBC 9.8 9.0  HGB 14.6 14.1  HCT 43.3 41.5  MCV 85.9 86.5  PLT 298 291    Anemia Panel: No results for input(s): VITAMINB12, FOLATE, FERRITIN, TIBC, IRON, RETICCTPCT in the last 72 hours. Coag Panel:   Lab Results  Component Value Date   INR 0.99 08/20/2015   INR 0.92 06/11/2014    RADIOLOGY: Ct Angio Head W/cm &/or Wo Cm  08/20/2015  ADDENDUM REPORT: 08/20/2015 07:10 ADDENDUM: Additional observations with this study: The acute left thalamic parenchymal hemorrhage is not significantly changed in size relative to previous exam from earlier today,  measuring 19 x 32 x 32 mm (estimated volume 9.7 cc). Similar localized vasogenic edema and mass effect. Intraventricular extension with the preponderance of intraventricular blood within the left lateral ventricle is similar, although smaller amounts of blood are present within the right lateral ventricle, third ventricle, with subsequent extension through the cerebral aqueduct into the fourth ventricle, stable. Localized left-to-right shift of approximately 5 mm and ventricular dilatation is also stable. Electronically Signed   By: Rise Mu M.D.   On: 08/20/2015 07:10  08/20/2015  CLINICAL DATA:  Initial valuation for acute intracranial hemorrhage. EXAM: CT ANGIOGRAPHY HEAD TECHNIQUE: Multidetector CT imaging of the head was performed using the standard protocol during bolus administration of intravenous contrast. Multiplanar CT image reconstructions and MIPs were obtained to evaluate the vascular anatomy. CONTRAST:  100 cc of Isovue 370. COMPARISON:  Prior CT from earlier the same day as well as prior MRA from 06/11/2014. FINDINGS: CTA HEAD Anterior circulation: Visualized distal cervical segments of the internal carotid arteries are widely patent. Petrous, cavernous, and supraclinoid segments widely patent without  flow-limiting stenosis. Minimal focal plaque within the cavernous left ICA noted. A1 segments, anterior communicating artery common anterior cerebral arteries well opacified. M1 segments widely patent without stenosis or occlusion. MCA bifurcations normal. Distal MCA branches well opacified and symmetric. Posterior circulation: Vertebral arteries patent to the vertebrobasilar junction. Posterior inferior cerebral arteries patent. Basilar artery widely patent. Superior cerebellar and posterior cerebral arteries well opacified bilaterally. Venous sinuses: Patent without evidence for venous sinus thrombosis. Anatomic variants: No anatomic variant. No aneurysm or vascular malformation. Delayed  phase:No pathologic enhancement. IMPRESSION: 1. Mild atheromatous irregularity within the cavernous ICAs bilaterally without significant stenosis. 2. Otherwise negative CTA of the neck. No high-grade or correctable stenosis. No large vessel occlusion. No aneurysm or vascular malformation. Electronically Signed: By: Rise Mu M.D. On: 08/20/2015 05:00   Ct Head Wo Contrast  08/24/2015  CLINICAL DATA:  Continued surveillance thalamic hematoma. EXAM: CT HEAD WITHOUT CONTRAST TECHNIQUE: Contiguous axial images were obtained from the base of the skull through the vertex without intravenous contrast. COMPARISON:  08/22/2015. FINDINGS: LEFT thalamic hematoma is redemonstrated, roughly unchanged in size measuring 18 x 33 mm cross-section. Mild surrounding edema. LEFT-to-RIGHT shift of 4 mm. Intraventricular hemorrhage primarily on the LEFT. Stable mild hydrocephalus, slight fullness of the temporal horns, stable. No new parenchymal hemorrhage. Calvarium intact.  No layering sinus or mastoid fluid. IMPRESSION: LEFT thalamic hematoma is stable. Mild hydrocephalus, without significant worsening. Electronically Signed   By: Elsie Stain M.D.   On: 08/24/2015 13:52   Ct Head Wo Contrast  08/22/2015  CLINICAL DATA:  Follow-up intracranial hemorrhage. EXAM: CT HEAD WITHOUT CONTRAST TECHNIQUE: Contiguous axial images were obtained from the base of the skull through the vertex without intravenous contrast. COMPARISON:  CT HEAD August 21, 2015 FINDINGS: INTRACRANIAL CONTENTS: Evolving 33 x 19 mm LEFT thalamic to corona radiata intraparenchymal hematoma with surrounding low-density vasogenic edema. Intraventricular extension of hemorrhage, with LEFT greater than RIGHT lateral ventricle blood products, blood products in the third and fourth ventricles similar to prior CT. 5 mm LEFT-to-RIGHT midline shift. Mild similar ventriculomegaly with periventricular white matter hypodensities. No acute large vascular territory  infarct. Mildly effaced basal cisterns. ORBITS: The included ocular globes and orbital contents are normal. SINUSES: Minimal LEFT anterior ethmoid mucosal thickening. Mastoid air cells are well aerated. SKULL/SOFT TISSUES: No skull fracture. No significant soft tissue swelling. IMPRESSION: Evolving LEFT thalamus hematoma with intraventricular extension. Stable mild hydrocephalus and interstitial edema. Stable 5 mm LEFT-to-RIGHT midline shift. Electronically Signed   By: Awilda Metro M.D.   On: 08/22/2015 02:06   Ct Head Wo Contrast  08/21/2015  CLINICAL DATA:  Follow-up intracranial hemorrhage. History of hypertension. EXAM: CT HEAD WITHOUT CONTRAST TECHNIQUE: Contiguous axial images were obtained from the base of the skull through the vertex without intravenous contrast. COMPARISON:  CT HEAD August 20, 2015 FINDINGS: INTRACRANIAL CONTENTS: Evolving 3.2 x 2.2 cm relatively stable LEFT thalamus to corona radiata intraparenchymal hematoma with surrounding low-density vasogenic edema. Intraventricular extension, with partially effaced third ventricle, stable 5 mm LEFT-to-RIGHT midline shift. Confluent periventricular white matter hypodensities with mild dilatation RIGHT lateral ventricle. Mild sulcal effacement of the convexities. Mild basilar cistern effacement. Minimal calcific atherosclerosis of the carotid bulbs. ORBITS: The included ocular globes and orbital contents are normal. SINUSES: The mastoid aircells and included paranasal sinuses are well-aerated. SKULL/SOFT TISSUES: No skull fracture. No significant soft tissue swelling. IMPRESSION: Evolving LEFT thalamus hematoma with intraventricular extension. Mild hydrocephalus and periventricular hypodensity/interstitial edema with mild RIGHT ventricular entrapment. Stable 5 mm LEFT-to-RIGHT  midline shift. Electronically Signed   By: Awilda Metroourtnay  Bloomer M.D.   On: 08/21/2015 04:05   Ct Head Wo Contrast  08/20/2015  CLINICAL DATA:  50 year old male with  intoxication with alcohol. History of hypertension. EXAM: CT HEAD WITHOUT CONTRAST TECHNIQUE: Contiguous axial images were obtained from the base of the skull through the vertex without intravenous contrast. COMPARISON:  Head CT dated 06/12/2014 FINDINGS: There is a 3.4 x 2.3 cm left thalamic hemorrhage with intraventricular extension. Blood fills the left lateral ventricle, third ventricle as well as the fourth ventricle. There is mass effect and approximately 5 mm left-to-right midline shift. There is mild periventricular and deep white matter chronic microvascular ischemic changes, advanced for the patient's age. The visualized paranasal sinuses and mastoid air cells are clear. The calvarium is intact. IMPRESSION: Left thalamic hemorrhage with extension into the intraventricular system and approximately 5 mm left-to-right midline shift. These results were called by telephone at the time of interpretation on 08/20/2015 at 2:55 am to Dr. Melene PlanAN FLOYD , who verbally acknowledged these results. Electronically Signed   By: Elgie CollardArash  Radparvar M.D.   On: 08/20/2015 02:59   Dg Chest Port 1 View  08/24/2015  CLINICAL DATA:  Aspiration EXAM: PORTABLE CHEST 1 VIEW COMPARISON:  08/21/2015 FINDINGS: Heart and mediastinal contours are within normal limits. No focal opacities or effusions. No acute bony abnormality. Feeding tube is noted in place, projecting off the lower aspect of the image. Tip is not visualized. IMPRESSION: No active disease. Electronically Signed   By: Charlett NoseKevin  Dover M.D.   On: 08/24/2015 14:30   Dg Chest Port 1 View  08/21/2015  CLINICAL DATA:  Hypertension for 2 days. EXAM: PORTABLE CHEST 1 VIEW COMPARISON:  None. FINDINGS: The heart is enlarged. There is mild tortuosity of the thoracic aorta. The pulmonary hila appear normal. The lungs are clear. No pleural effusion. The bony thorax is intact. IMPRESSION: Mild cardiac enlargement.  No acute pulmonary findings. Electronically Signed   By: Rudie MeyerP.  Gallerani M.D.    On: 08/21/2015 20:56   Dg Abd Portable 1v  08/23/2015  CLINICAL DATA:  50 year old male with NG tube placement EXAM: PORTABLE ABDOMEN - 1 VIEW COMPARISON:  Abdominal radiograph dated 08/23/2015 FINDINGS: An enteric tube is noted extending into the right hemi abdomen with tip adjacent to the right transverse process of the L4, likely at the junction of the second and third portion of the duodenum. There is no bowel dilatation or evidence of obstruction. Moderate stool noted throughout the colon. No no free air. No radiopaque calculi. There is degenerative changes of the spine. No acute fracture. IMPRESSION: Enteric tube the tip likely in the duodenum. Electronically Signed   By: Elgie CollardArash  Radparvar M.D.   On: 08/23/2015 23:32   Dg Abd Portable 1v  08/23/2015  CLINICAL DATA:  Patient status post feeding tube placement. EXAM: PORTABLE ABDOMEN - 1 VIEW COMPARISON:  Abdominal radiograph 08/22/2015. FINDINGS: Weighted feeding tube tip projects over the left upper quadrant, likely within the stomach. Nonobstructed bowel gas pattern. Lung bases are clear. Regional skeleton unremarkable. IMPRESSION: Weighted feeding tube tip projects over the stomach. Electronically Signed   By: Annia Beltrew  Davis M.D.   On: 08/23/2015 19:20   Dg Abd Portable 1v  08/22/2015  CLINICAL DATA:  Feeding tube placement EXAM: PORTABLE ABDOMEN - 1 VIEW COMPARISON:  None. FINDINGS: Feeding tube tip is in the descending duodenum. Nonobstructive bowel gas pattern. Visualized lung bases clear. IMPRESSION: Feeding tube tip in the distal second portion of the  duodenum. Electronically Signed   By: Charlett NoseKevin  Dover M.D.   On: 08/22/2015 16:07   Dg Swallowing Func-speech Pathology  08/24/2015  Objective Swallowing Evaluation: Type of Study: MBS-Modified Barium Swallow Study Patient Details Name: Andre [name] MRN: [id number] Date of Birth: Jul 16, 1965 Today's Date: 08/24/2015 Time: SLP Start Time (ACUTE ONLY): 1309-SLP Stop Time (ACUTE ONLY): 1319 SLP Time  Calculation (min) (ACUTE ONLY): 10 min Past Medical History: Past Medical History Diagnosis Date . Hypertension  Past Surgical History: Past Surgical History Procedure Laterality Date . Appendectomy   . Abdominal surgery   HPI: Andre Jarredheodore W Hulsey is a 50 y.o. male with a history of HTN who was found in the bathroom and and had vomitted. CT showed a large BG hemorrhage with extensive IVH. PMH: tobacco use, ETOH abuse, ICH posterior pons and midrain- no documentation of swallow assessment.  Subjective: pt alert, aphasic Assessment / Plan / Recommendation CHL IP CLINICAL IMPRESSIONS 08/24/2015 Therapy Diagnosis Moderate oral phase dysphagia;Moderate pharyngeal phase dysphagia Clinical Impression Pt has a mdoerate oropharyngeal dysphagia due to sensorimotor deficits. He has delayed oral transit with all consistencies and oral residue with pureed textures. Delay in swallow trigger results in silent penetration before the swallow with thin liquids, which is not cleared with weak cued throat clear. No further penetration/aspiration occurs with thicker liquids and solids, but he does have vallecular residue with purees due to reduced base of tongue retraction. Recommend Dys 1 diet and nectar thick liquids with full supervision and strict use of aspiration precautions. Pt will need full supervision for safety, partcularly in light of his significant aphasia. Impact on safety and function Mild aspiration risk;Moderate aspiration risk   CHL IP TREATMENT RECOMMENDATION 08/24/2015 Treatment Recommendations Therapy as outlined in treatment plan below   Prognosis 08/24/2015 Prognosis for Safe Diet Advancement Good Barriers to Reach Goals Severity of deficits Barriers/Prognosis Comment -- CHL IP DIET RECOMMENDATION 08/24/2015 SLP Diet Recommendations Dysphagia 1 (Puree) solids;Nectar thick liquid Liquid Administration via Cup;Straw Medication Administration Crushed with puree Compensations Minimize environmental distractions;Slow  rate;Small sips/bites;Lingual sweep for clearance of pocketing Postural Changes Remain semi-upright after after feeds/meals (Comment);Seated upright at 90 degrees   CHL IP OTHER RECOMMENDATIONS 08/24/2015 Recommended Consults -- Oral Care Recommendations Oral care BID Other Recommendations Order thickener from pharmacy;Prohibited food (jello, ice cream, thin soups);Remove water pitcher   CHL IP FOLLOW UP RECOMMENDATIONS 08/24/2015 Follow up Recommendations Inpatient Rehab   CHL IP FREQUENCY AND DURATION 08/24/2015 Speech Therapy Frequency (ACUTE ONLY) min 2x/week Treatment Duration 2 weeks      CHL IP ORAL PHASE 08/24/2015 Oral Phase Impaired Oral - Pudding Teaspoon -- Oral - Pudding Cup -- Oral - Honey Teaspoon -- Oral - Honey Cup -- Oral - Nectar Teaspoon Weak lingual manipulation;Reduced posterior propulsion;Delayed oral transit;Decreased bolus cohesion Oral - Nectar Cup -- Oral - Nectar Straw Weak lingual manipulation;Reduced posterior propulsion;Delayed oral transit;Decreased bolus cohesion Oral - Thin Teaspoon -- Oral - Thin Cup -- Oral - Thin Straw Weak lingual manipulation;Reduced posterior propulsion;Delayed oral transit;Decreased bolus cohesion Oral - Puree Weak lingual manipulation;Reduced posterior propulsion;Lingual/palatal residue;Delayed oral transit;Decreased bolus cohesion Oral - Mech Soft -- Oral - Regular -- Oral - Multi-Consistency -- Oral - Pill -- Oral Phase - Comment --  CHL IP PHARYNGEAL PHASE 08/24/2015 Pharyngeal Phase Impaired Pharyngeal- Pudding Teaspoon -- Pharyngeal -- Pharyngeal- Pudding Cup -- Pharyngeal -- Pharyngeal- Honey Teaspoon -- Pharyngeal -- Pharyngeal- Honey Cup -- Pharyngeal -- Pharyngeal- Nectar Teaspoon Delayed swallow initiation-pyriform sinuses;Reduced anterior laryngeal mobility;Reduced laryngeal elevation;Reduced tongue base retraction  Pharyngeal -- Pharyngeal- Nectar Cup -- Pharyngeal -- Pharyngeal- Nectar Straw Delayed swallow initiation-pyriform sinuses;Reduced anterior  laryngeal mobility;Reduced laryngeal elevation;Reduced tongue base retraction Pharyngeal -- Pharyngeal- Thin Teaspoon -- Pharyngeal -- Pharyngeal- Thin Cup -- Pharyngeal -- Pharyngeal- Thin Straw Delayed swallow initiation-pyriform sinuses;Reduced anterior laryngeal mobility;Reduced laryngeal elevation;Reduced tongue base retraction;Penetration/Aspiration before swallow Pharyngeal Material enters airway, remains ABOVE vocal cords and not ejected out Pharyngeal- Puree Delayed swallow initiation-pyriform sinuses;Reduced anterior laryngeal mobility;Reduced laryngeal elevation;Reduced tongue base retraction;Pharyngeal residue - valleculae Pharyngeal -- Pharyngeal- Mechanical Soft -- Pharyngeal -- Pharyngeal- Regular -- Pharyngeal -- Pharyngeal- Multi-consistency -- Pharyngeal -- Pharyngeal- Pill -- Pharyngeal -- Pharyngeal Comment --  CHL IP CERVICAL ESOPHAGEAL PHASE 08/24/2015 Cervical Esophageal Phase WFL Pudding Teaspoon -- Pudding Cup -- Honey Teaspoon -- Honey Cup -- Nectar Teaspoon -- Nectar Cup -- Nectar Straw -- Thin Teaspoon -- Thin Cup -- Thin Straw -- Puree -- Mechanical Soft -- Regular -- Multi-consistency -- Pill -- Cervical Esophageal Comment -- No flowsheet data found. Maxcine Ham 08/24/2015, 2:06 PM  Maxcine Ham, M.A. CCC-SLP [phone]                ASSESSMENT: Roosvelt Harps:    1) HTN:  On 4 drug RX if BP remains elevated consider adding diuretic or clonidine per primary service 2. NSVT resolved continue beta blocker   See note Dr Eldridge Dace no further cardiac recs  Wendall Stade, MD  08/25/2015  9:48 AM

## 2015-08-25 NOTE — Progress Notes (Signed)
STROKE TEAM PROGRESS NOTE   SUBJECTIVE (INTERVAL HISTORY) No family at bedside. His RN is at bedside. Pt off cleviprex. Relaxed the BP goal to < 180 as he has been 4 days after ICH. Repeat CT to rule out hydrocephalus yesterday was stable . Increased BP meds for better BP control. Pt had fever. UA was negative.  Blood cultures are in progress. CXR negative. Temp today is normal, WBCs normal.    OBJECTIVE Temp:  [97.6 F (36.4 C)-100.2 F (37.9 C)] 98.3 F (36.8 C) (06/24 1006) Pulse Rate:  [74-113] 113 (06/24 1006) Cardiac Rhythm:  [-] Sinus tachycardia (06/24 0700) Resp:  [16-20] 18 (06/24 1006) BP: (112-184)/(71-105) 176/97 mmHg (06/24 1006) SpO2:  [97 %-100 %] 100 % (06/24 1006)  CBC:   Recent Labs Lab 08/20/15 0205  08/24/15 0350 08/25/15 0544  WBC 10.9*  < > 9.8 9.0  NEUTROABS 9.4*  --   --   --   HGB 13.6  < > 14.6 14.1  HCT 39.5  < > 43.3 41.5  MCV 86.6  < > 85.9 86.5  PLT 333  < > 298 291  < > = values in this interval not displayed.  Basic Metabolic Panel:   Recent Labs Lab 08/22/15 1308  08/24/15 0350 08/25/15 0544  NA 134*  < > 135 134*  K 3.5  < > 3.7 3.5  CL 101  < > 105 101  CO2 23  < > 22 23  GLUCOSE 118*  < > 130* 125*  BUN 18  < > 24* 28*  CREATININE 1.26*  < > 1.31* 1.44*  CALCIUM 8.9  < > 8.8* 9.4  MG 2.1  --   --   --   < > = values in this interval not displayed.  Lipid Panel:     Component Value Date/Time   CHOL 243* 08/21/2015 0947   TRIG 206* 08/21/2015 0947   HDL 45 08/21/2015 0947   CHOLHDL 5.4 08/21/2015 0947   VLDL 41* 08/21/2015 0947   LDLCALC 157* 08/21/2015 0947   HgbA1c:  Lab Results  Component Value Date   HGBA1C 5.8* 08/21/2015   Urine Drug Screen:     Component Value Date/Time   LABOPIA NONE DETECTED 08/21/2015 0658   COCAINSCRNUR NONE DETECTED 08/21/2015 0658   LABBENZ NONE DETECTED 08/21/2015 0658   AMPHETMU NONE DETECTED 08/21/2015 0658   THCU NONE DETECTED 08/21/2015 0658   LABBARB NONE DETECTED 08/21/2015  0658      IMAGING I have personally reviewed the radiological images below and agree with the radiology interpretations.  Ct Angio Head W/cm &/or Wo Cm 08/20/2015  ADDENDUM: Additional observations with this study: The acute left thalamic parenchymal hemorrhage is not significantly changed in size relative to previous exam from earlier today, measuring 19 x 32 x 32 mm (estimated volume 9.7 cc). Similar localized vasogenic edema and mass effect. Intraventricular extension with the preponderance of intraventricular blood within the left lateral ventricle is similar, although smaller amounts of blood are present within the right lateral ventricle, third ventricle, with subsequent extension through the cerebral aqueduct into the fourth ventricle, stable. Localized left-to-right shift of approximately 5 mm and ventricular dilatation is also 1. Mild atheromatous irregularity within the cavernous ICAs bilaterally without significant stenosis. 2. Otherwise negative CTA of the neck. No high-grade or correctable stenosis. No large vessel occlusion. No aneurysm or vascular malformation.   Ct Head Wo Contrast 08/22/2015 Evolving LEFT thalamus hematoma with intraventricular extension. Stable mild hydrocephalus and interstitial  edema. Stable 5 mm LEFT-to-RIGHT midline shift.  08/21/2015   Evolving LEFT thalamus hematoma with intraventricular extension. Mild hydrocephalus and periventricular hypodensity/interstitial edema with mild RIGHT ventricular entrapment. Stable 5 mm LEFT-to-RIGHT midline shift.   08/20/2015   Left thalamic hemorrhage with extension into the intraventricular system and approximately 5 mm left-to-right midline shift.   Carotid Doppler   There is 1-39% bilateral ICA stenosis. Vertebral artery flow is antegrade.   TTE - Left ventricle: The cavity size was normal. Wall thickness was normal. Systolic function was normal. The estimated ejection fraction was in the range of 60% to 65%. Wall motion  was normal; there were no regional wall motion abnormalities. Left ventricular diastolic function parameters were normal. - Left atrium: Shadowing artifact on PSL axis images. - Atrial septum: No defect or patent foramen ovale was identified. Impressions:   No cardiac source of emboli was indentified.   PHYSICAL EXAM: Stable today, he is more alert General - Well nourished, well developed, in no apparent distress.  Ophthalmologic - Fundi not visualized due to noncooperation.  Cardiovascular - Regular rate and rhythm.  Neuro - alert, global aphasia, minimal speech output limited to self only, did not follow commands. Right neglect, left gaze preference, but able to cross midline. Blinking to visual threat on the right, but not to the left. PERRL. Right facial droop, tongue in middle in mouth. Right hemiplegia, trace withdraw on right UE and 3-/5 RLE with pain stimulation. LUE and LLE spontaneous movement. Sensation, coordination and gait not tested.    ASSESSMENT/PLAN Mr. Zenovia Jarredheodore W Jensen is a 50 y.o. male with history of HTN found down in the bathroom. CT showed large left thalamic ICH.   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 No high grade stenosis  Repeat CT head remains stable  Carotid Doppler  No significant stenosis   2D Echo  EF 60-65%  LDL 157  HgbA1c 5.8  SCDs and subq heparin for VTE prophylaxis  Add amantadine for 7 days to improve arousal.  DIET - DYS 1 Room service appropriate?: Yes; Fluid consistency:: Nectar Thick  No antithrombotic prior to admission  Ongoing aggressive stroke risk factor management  Therapy recommendations:  CIR. Admissions coordinator following  Disposition:  pending    Fever  Tmax 101.4 but WBC 9.8, today afebrile.  Could be due to aspiration   CXR normal  UA no infection  BCx pending  Hold off Abx for now  UA was negative.  Blood cultures are in progress. CXR negative. Temp today is normal, WBCs normal.    Transient V-tach  Likely associated with ICH  EF 60-65%  Cardiology on board, appreciate recs  continue metoprolol  Hypertensive Emergency  BP as high as 215/120, 213/130 on arrival in setting of neuro changes  Remains On cleviprex drip  SBP goal < 160  home PO meds resumed - increase metoprolol to 75mg  bid, continue amlodipine 10mg  Qd and lisinopril 20mg  bid  Add hydralazine 50mg  Q8  Decrease IVF after TF increased  Continue to Wean off cleviprex as able  Secondary HTN work up so far negative.   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 Hart RochesterLawson, recommended a follow up as an outpatient in 4 weeks with a CT angiogram of abdomin and pelvis and to discuss further treatment.   Per EPIC, there has been no follow up  Will order CTA abd/pelvis once Cre normalized and stabilized - Cre 1.38->1.26->1.30->1.31  Other Active Problems  Hypokalemia 3.3 - supplement  Hospital day # 5  This patient with ICH with IVH, hydrocephalus, hypertensive emergency, dysphagia. Repeat CT to rule out hydrocephalus yesterday was stable  . Increased BP meds for better BP control. Pt had fever. UA was negative.  Blood cultures are in progress. CXR negative. Temp today is normal, WBCs normal.   Personally examined patient and images, and have participated in and made any corrections needed to history, physical, neuro exam,assessment and plan as stated above.  I have personally obtained the history, evaluated lab date, reviewed imaging studies and agree with radiology interpretations.    Naomie DeanAntonia Janaiyah Blackard, MD Stroke Neurology [phone] [facility]    To contact Stroke Continuity provider, please refer to [email or web address]. After hours, contact General Neurology

## 2015-08-26 ENCOUNTER — Encounter (HOSPITAL_COMMUNITY): Payer: Self-pay | Admitting: *Deleted

## 2015-08-26 DIAGNOSIS — I610 Nontraumatic intracerebral hemorrhage in hemisphere, subcortical: Secondary | ICD-10-CM | POA: Diagnosis not present

## 2015-08-26 DIAGNOSIS — G8100 Flaccid hemiplegia affecting unspecified side: Secondary | ICD-10-CM | POA: Diagnosis not present

## 2015-08-26 DIAGNOSIS — I161 Hypertensive emergency: Secondary | ICD-10-CM | POA: Diagnosis not present

## 2015-08-26 LAB — GLUCOSE, CAPILLARY
GLUCOSE-CAPILLARY: 154 mg/dL — AB (ref 65–99)
GLUCOSE-CAPILLARY: 153 mg/dL — AB (ref 65–99)
GLUCOSE-CAPILLARY: 123 mg/dL — AB (ref 65–99)
Glucose-Capillary: 126 mg/dL — ABNORMAL HIGH (ref 65–99)
Glucose-Capillary: 138 mg/dL — ABNORMAL HIGH (ref 65–99)
Glucose-Capillary: 140 mg/dL — ABNORMAL HIGH (ref 65–99)
Glucose-Capillary: 145 mg/dL — ABNORMAL HIGH (ref 65–99)

## 2015-08-26 NOTE — Progress Notes (Signed)
STROKE TEAM PROGRESS NOTE   HISTORY ON ADMISSION Andre [name] is a 50 y.o. male with a history of htn who was found in the bathroom in his own vomit. He was brought to the ER via EMS where a head CT was obtained which showed a large BG hemorrhage with extensive IVH.    LKW: unclear tpa given?: no, ICH ICH Score: 2    SUBJECTIVE (INTERVAL HISTORY) UA was negative.  Blood cultures are in progress. CXR negative. Temp today is normal, WBCs normal yesterday. Patient's family is at bedside including his grandmother and brother and mother. Discussed situation, reviewed images, they would like to be involved in the selection of his rehabilitation facility possibly in [place]. Grandmother will be here all morning until 2 PM with like to discuss with the case manager.   OBJECTIVE Temp:  [97.9 F (36.6 C)-100.4 F (38 C)] 99.9 F (37.7 C) (06/25 0850) Pulse Rate:  [80-116] 116 (06/25 0850) Cardiac Rhythm:  [-] Sinus tachycardia (06/25 0717) Resp:  [18-20] 20 (06/25 0850) BP: (158-188)/(82-105) 160/93 mmHg (06/25 0850) SpO2:  [98 %-100 %] 100 % (06/25 0850)  CBC:   Recent Labs Lab 08/20/15 0205  08/24/15 0350 08/25/15 0544  WBC 10.9*  < > 9.8 9.0  NEUTROABS 9.4*  --   --   --   HGB 13.6  < > 14.6 14.1  HCT 39.5  < > 43.3 41.5  MCV 86.6  < > 85.9 86.5  PLT 333  < > 298 291  < > = values in this interval not displayed.  Basic Metabolic Panel:   Recent Labs Lab 08/22/15 1308  08/24/15 0350 08/25/15 0544  NA 134*  < > 135 134*  K 3.5  < > 3.7 3.5  CL 101  < > 105 101  CO2 23  < > 22 23  GLUCOSE 118*  < > 130* 125*  BUN 18  < > 24* 28*  CREATININE 1.26*  < > 1.31* 1.44*  CALCIUM 8.9  < > 8.8* 9.4  MG 2.1  --   --   --   < > = values in this interval not displayed.  Lipid Panel:     Component Value Date/Time   CHOL 243* 08/21/2015 0947   TRIG 206* 08/21/2015 0947   HDL 45 08/21/2015 0947   CHOLHDL 5.4 08/21/2015 0947   VLDL 41* 08/21/2015 0947   LDLCALC 157*  08/21/2015 0947   HgbA1c:  Lab Results  Component Value Date   HGBA1C 5.8* 08/21/2015   Urine Drug Screen:     Component Value Date/Time   LABOPIA NONE DETECTED 08/21/2015 0658   COCAINSCRNUR NONE DETECTED 08/21/2015 0658   LABBENZ NONE DETECTED 08/21/2015 0658   AMPHETMU NONE DETECTED 08/21/2015 0658   THCU NONE DETECTED 08/21/2015 0658   LABBARB NONE DETECTED 08/21/2015 0658      IMAGING I have personally reviewed the radiological images below and agree with the radiology interpretations.  Ct Angio Head W/cm &/or Wo Cm 08/20/2015  ADDENDUM: Additional observations with this study: The acute left thalamic parenchymal hemorrhage is not significantly changed in size relative to previous exam from earlier today, measuring 19 x 32 x 32 mm (estimated volume 9.7 cc). Similar localized vasogenic edema and mass effect. Intraventricular extension with the preponderance of intraventricular blood within the left lateral ventricle is similar, although smaller amounts of blood are present within the right lateral ventricle, third ventricle, with subsequent extension through the cerebral aqueduct into the fourth  ventricle, stable. Localized left-to-right shift of approximately 5 mm and ventricular dilatation is also 1. Mild atheromatous irregularity within the cavernous ICAs bilaterally without significant stenosis. 2. Otherwise negative CTA of the neck. No high-grade or correctable stenosis. No large vessel occlusion. No aneurysm or vascular malformation.   Ct Head Wo Contrast 08/22/2015 Evolving LEFT thalamus hematoma with intraventricular extension. Stable mild hydrocephalus and interstitial edema. Stable 5 mm LEFT-to-RIGHT midline shift.  08/21/2015   Evolving LEFT thalamus hematoma with intraventricular extension. Mild hydrocephalus and periventricular hypodensity/interstitial edema with mild RIGHT ventricular entrapment. Stable 5 mm LEFT-to-RIGHT midline shift.   08/20/2015   Left thalamic  hemorrhage with extension into the intraventricular system and approximately 5 mm left-to-right midline shift.   Carotid Doppler   There is 1-39% bilateral ICA stenosis. Vertebral artery flow is antegrade.   TTE - Left ventricle: The cavity size was normal. Wall thickness was normal. Systolic function was normal. The estimated ejection fraction was in the range of 60% to 65%. Wall motion was normal; there were no regional wall motion abnormalities. Left ventricular diastolic function parameters were normal. - Left atrium: Shadowing artifact on PSL axis images. - Atrial septum: No defect or patent foramen ovale was identified. Impressions:   No cardiac source of emboli was indentified.   PHYSICAL EXAM:  Stable today, he is more alert General - Well nourished, well developed, in no apparent distress.  Ophthalmologic - Fundi not visualized due to noncooperation.  Cardiovascular - Regular rate and rhythm.  Neuro - alert, global aphasia, minimal speech output limited to self only, did not follow commands. Right neglect, left gaze preference, but able to cross midline. Blinking to visual threat on the right, but not to the left. PERRL. Right facial droop, tongue in middle in mouth. Right hemiplegia, trace withdraw on right UE and 3-/5 RLE with pain stimulation. LUE and LLE spontaneous movement. Sensation, coordination and gait not tested.    ASSESSMENT/PLAN [name] is a 50 y.o. male with history of HTN found down in the bathroom. CT showed large left thalamic ICH.   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 No high grade stenosis  Repeat CT head remains stable  Carotid Doppler  No significant stenosis   2D Echo  EF  60-65%  LDL 157  HgbA1c 5.8  SCDs and subq heparin for VTE prophylaxis  Amantadine added for 7 days to improve arousal. Day # 4/7 DIET - DYS 1 Room service appropriate?: Yes; Fluid consistency:: Nectar Thick  No antithrombotic prior to admission  Ongoing aggressive stroke risk factor management  Therapy recommendations:  CIR. Admissions coordinator following  Disposition:  pending   Fever  Tmax 101.4 but WBC 9.8, Temp 99.9 Sunday  Could be due to aspiration   CXR normal  UA no infection  BCx - no growth x 2 days  Hold off Abx for now  UA was negative.  Blood cultures are in progress. CXR negative. Temp today is normal, WBCs normal.    Transient V-tach  Likely associated with ICH  EF 60-65%  Cardiology on board, appreciate recs  continue metoprolol  Hypertensive Emergency  BP as high as 215/120, 213/130 on arrival in setting of neuro changes  Remains On cleviprex  drip  SBP goal < 160  home PO meds resumed - increase metoprolol to 75mg  bid, continue amlodipine 10mg  Qd and lisinopril 20mg  bid  Add hydralazine 50mg  Q8  Decrease IVF after TF increased  Continue to Wean off cleviprex as able  Secondary HTN work up so far negative.   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 Hart RochesterLawson, recommended a follow up as an  outpatient in 4 weeks with a CT angiogram of abdomin and pelvis and to discuss further treatment.   Per EPIC, there has been no follow up  Will order CTA abd/pelvis once Cre normalized and stabilized - Cre 1.38->1.26->1.30->1.31 -> 1.44 - (Lisinopril may be contributing to renal dysfunction) Increase IV fluids and recheck Bmet. In AM  Other Active Problems  Hypokalemia 3.3 - supplement  Hospital day # 6  This patient with ICH with IVH, hydrocephalus, hypertensive emergency, dysphagia. Repeat CT to rule out hydrocephalus was stable . Increased BP meds for better BP control. Pt had fever. UA was negative.  Blood cultures are in progress. CXR negative. Temp today is normal, WBCs normal.   Personally examined patient and images, and have participated in and made any corrections needed to history, physical, neuro exam,assessment and plan as stated above.  I have personally obtained the history, evaluated lab date, reviewed imaging studies and agree with radiology interpretations.    Naomie DeanAntonia Ahern, MD Stroke Neurology [phone] [facility]    To contact Stroke Continuity provider, please refer to [email or web address]. After hours, contact General Neurology

## 2015-08-27 DIAGNOSIS — I610 Nontraumatic intracerebral hemorrhage in hemisphere, subcortical: Secondary | ICD-10-CM | POA: Diagnosis not present

## 2015-08-27 DIAGNOSIS — I69391 Dysphagia following cerebral infarction: Secondary | ICD-10-CM | POA: Diagnosis not present

## 2015-08-27 DIAGNOSIS — R509 Fever, unspecified: Secondary | ICD-10-CM | POA: Diagnosis not present

## 2015-08-27 DIAGNOSIS — G8100 Flaccid hemiplegia affecting unspecified side: Secondary | ICD-10-CM | POA: Diagnosis not present

## 2015-08-27 LAB — BASIC METABOLIC PANEL
ANION GAP: 6 (ref 5–15)
BUN: 43 mg/dL — ABNORMAL HIGH (ref 6–20)
CO2: 25 mmol/L (ref 22–32)
Calcium: 9.4 mg/dL (ref 8.9–10.3)
Chloride: 116 mmol/L — ABNORMAL HIGH (ref 101–111)
Creatinine, Ser: 1.71 mg/dL — ABNORMAL HIGH (ref 0.61–1.24)
GFR calc Af Amer: 52 mL/min — ABNORMAL LOW (ref >60)
GFR calc non Af Amer: 45 mL/min — ABNORMAL LOW (ref >60)
GLUCOSE: 139 mg/dL — AB (ref 65–99)
POTASSIUM: 3.9 mmol/L (ref 3.5–5.1)
Sodium: 147 mmol/L — ABNORMAL HIGH (ref 135–145)

## 2015-08-27 LAB — GLUCOSE, CAPILLARY
GLUCOSE-CAPILLARY: 118 mg/dL — AB (ref 65–99)
GLUCOSE-CAPILLARY: 124 mg/dL — AB (ref 65–99)
GLUCOSE-CAPILLARY: 128 mg/dL — AB (ref 65–99)
GLUCOSE-CAPILLARY: 135 mg/dL — AB (ref 65–99)
Glucose-Capillary: 118 mg/dL — ABNORMAL HIGH (ref 65–99)
Glucose-Capillary: 124 mg/dL — ABNORMAL HIGH (ref 65–99)

## 2015-08-27 MED ORDER — ENSURE ENLIVE PO LIQD
237.0000 mL | Freq: Two times a day (BID) | ORAL | Status: DC
  Administered 2015-08-28 – 2015-08-31 (×3): 237 mL via ORAL

## 2015-08-27 MED ORDER — METOPROLOL TARTRATE 100 MG PO TABS
100.0000 mg | ORAL_TABLET | Freq: Two times a day (BID) | ORAL | Status: DC
  Administered 2015-08-27 – 2015-08-31 (×8): 100 mg via ORAL
  Filled 2015-08-27 (×9): qty 1

## 2015-08-27 MED ORDER — ACETAMINOPHEN 325 MG PO TABS
650.0000 mg | ORAL_TABLET | Freq: Four times a day (QID) | ORAL | Status: DC | PRN
  Administered 2015-08-27 – 2015-08-30 (×4): 650 mg via ORAL

## 2015-08-27 MED ORDER — METOPROLOL TARTRATE 25 MG PO TABS
25.0000 mg | ORAL_TABLET | Freq: Once | ORAL | Status: AC
  Administered 2015-08-27: 25 mg via ORAL
  Filled 2015-08-27: qty 1

## 2015-08-27 NOTE — Progress Notes (Signed)
Speech Language Pathology Treatment: Dysphagia;Cognitive-Linquistic  Patient Details Name: Zenovia Jarredheodore W Nettleton MRN: [id number] DOB: 12/10/1965 Today's Date: 08/27/2015 Time: [id number] SLP Time Calculation (min) (ACUTE ONLY): 18 min  Assessment / Plan / Recommendation Clinical Impression  Dysphagia treatment provided for diet tolerance check/ trials of advanced solids. Pt showed no overt s/s of aspiration at bedside today. Suspect that swallow initiation continues to be mildly delayed. Pt continues to have a prolonged oral phase/ suspected delayed oral transit with puree consistencies, along with multiple swallows with both dysphagia 1 and dysphagia 2 consistencies suspicious for residuals. No oral residuals or pocketing noted today. Recommend continuing with dysphagia 1 diet, nectar-thick liquids, meds crushed in puree, full supervision to assist with feeding if needed, cue small bites/ sips, check for oral residuals or pocketing. Will continue to follow for diet tolerance/ consider advancement.   Cognitive-linguistic treatment focused on aphasia. Pt able to state first name independently, able to repeat last name. Pt able to count to 10 with maximal verbal/ visual cues. Able to state location when given 2 choices, unable to state independently. Able to identify correct item from field of 2 in 4 out of 4 opportunities by looking at item- unable to point. Able to follow functional 1-step directions during dysphagia treatment. Pt repeated an unintelligible phrase several times- sounded like an approximation of "what are we going to do" but when asked pt if that was what he said, pt did not respond. Will continue to follow for aphasia treatment to increase functional communication for safety in the acute care setting.    HPI HPI: Zenovia Jarredheodore W Tamas is a 50 y.o. male with a history of HTN who was found in the bathroom and and had vomitted. CT showed a large BG hemorrhage with extensive IVH. PMH: tobacco use,  ETOH abuse, ICH posterior pons and midrain- no documentation of swallow assessment.       SLP Plan  Continue with current plan of care     Recommendations  Diet recommendations: Dysphagia 1 (puree);Nectar-thick liquid Liquids provided via: Cup;Straw Medication Administration: Crushed with puree Supervision: Full supervision/cueing for compensatory strategies;Staff to assist with self feeding Compensations: Minimize environmental distractions;Slow rate;Small sips/bites;Lingual sweep for clearance of pocketing Postural Changes and/or Swallow Maneuvers: Seated upright 90 degrees             Oral Care Recommendations: Oral care BID Follow up Recommendations: Inpatient Rehab Plan: Continue with current plan of care     GO                Metro KungOleksiak, Julies Carmickle K, MA, CCC-SLP 08/27/2015, 3:30 PM [phone]

## 2015-08-27 NOTE — Care Management Note (Signed)
Case Management Note  Patient Details  Name: Andre [name] MRN: [id number] Date of Birth: 1965-05-12  Subjective/Objective:                    Action/Plan: [facility]. CM following for d/c needs.   Expected Discharge Date:                  Expected Discharge Plan:  IP Rehab Facility  In-House Referral:     Discharge planning Services  CM Consult  Post Acute Care Choice:    Choice offered to:     DME Arranged:    DME Agency:     HH Arranged:    HH Agency:     Status of Service:  In process, will continue to follow  If discussed at Long Length of Stay Meetings, dates discussed:    Additional Comments:  [name], RN 08/27/2015, 2:18 PM

## 2015-08-27 NOTE — Progress Notes (Signed)
STROKE TEAM PROGRESS NOTE   HISTORY ON ADMISSION Andre [name] is a 50 y.o. male with a history of htn who was found in the bathroom in his own vomit. He was brought to the ER via EMS where a head CT was obtained which showed a large BG hemorrhage with extensive IVH.    LKW: unclear tpa given?: no, ICH ICH Score: 2    SUBJECTIVE (INTERVAL HISTORY)  patient lying comfortably in bed. No changes.low grade fever persists.  OBJECTIVE Temp:  [98.4 F (36.9 C)-100.6 F (38.1 C)] 100.4 F (38 C) (06/26 1507) Pulse Rate:  [90-111] 90 (06/26 1507) Cardiac Rhythm:  [-] Normal sinus rhythm (06/26 0807) Resp:  [18-24] 20 (06/26 1507) BP: (161-187)/(98-109) 175/98 mmHg (06/26 1507) SpO2:  [98 %-99 %] 99 % (06/26 1507) Weight:  [150 lb 12.8 oz (68.402 kg)] 150 lb 12.8 oz (68.402 kg) (06/26 0447)  CBC:   Recent Labs Lab 08/24/15 0350 08/25/15 0544  WBC 9.8 9.0  HGB 14.6 14.1  HCT 43.3 41.5  MCV 85.9 86.5  PLT 298 291    Basic Metabolic Panel:   Recent Labs Lab 08/22/15 1308  08/25/15 0544 08/27/15 0643  NA 134*  < > 134* 147*  K 3.5  < > 3.5 3.9  CL 101  < > 101 116*  CO2 23  < > 23 25  GLUCOSE 118*  < > 125* 139*  BUN 18  < > 28* 43*  CREATININE 1.26*  < > 1.44* 1.71*  CALCIUM 8.9  < > 9.4 9.4  MG 2.1  --   --   --   < > = values in this interval not displayed.  Lipid Panel:     Component Value Date/Time   CHOL 243* 08/21/2015 0947   TRIG 206* 08/21/2015 0947   HDL 45 08/21/2015 0947   CHOLHDL 5.4 08/21/2015 0947   VLDL 41* 08/21/2015 0947   LDLCALC 157* 08/21/2015 0947   HgbA1c:  Lab Results  Component Value Date   HGBA1C 5.8* 08/21/2015   Urine Drug Screen:     Component Value Date/Time   LABOPIA NONE DETECTED 08/21/2015 0658   COCAINSCRNUR NONE DETECTED 08/21/2015 0658   LABBENZ NONE DETECTED 08/21/2015 0658   AMPHETMU NONE DETECTED 08/21/2015 0658   THCU NONE DETECTED 08/21/2015 0658   LABBARB NONE DETECTED 08/21/2015 0658      IMAGING I  have personally reviewed the radiological images below and agree with the radiology interpretations.  Ct Angio Head W/cm &/or Wo Cm 08/20/2015  ADDENDUM: Additional observations with this study: The acute left thalamic parenchymal hemorrhage is not significantly changed in size relative to previous exam from earlier today, measuring 19 x 32 x 32 mm (estimated volume 9.7 cc). Similar localized vasogenic edema and mass effect. Intraventricular extension with the preponderance of intraventricular blood within the left lateral ventricle is similar, although smaller amounts of blood are present within the right lateral ventricle, third ventricle, with subsequent extension through the cerebral aqueduct into the fourth ventricle, stable. Localized left-to-right shift of approximately 5 mm and ventricular dilatation is also 1. Mild atheromatous irregularity within the cavernous ICAs bilaterally without significant stenosis. 2. Otherwise negative CTA of the neck. No high-grade or correctable stenosis. No large vessel occlusion. No aneurysm or vascular malformation.   Ct Head Wo Contrast 08/22/2015 Evolving LEFT thalamus hematoma with intraventricular extension. Stable mild hydrocephalus and interstitial edema. Stable 5 mm LEFT-to-RIGHT midline shift.  08/21/2015   Evolving LEFT thalamus hematoma with intraventricular  extension. Mild hydrocephalus and periventricular hypodensity/interstitial edema with mild RIGHT ventricular entrapment. Stable 5 mm LEFT-to-RIGHT midline shift.   08/20/2015   Left thalamic hemorrhage with extension into the intraventricular system and approximately 5 mm left-to-right midline shift.   Carotid Doppler   There is 1-39% bilateral ICA stenosis. Vertebral artery flow is antegrade.   TTE - Left ventricle: The cavity size was normal. Wall thickness was normal. Systolic function was normal. The estimated ejection fraction was in the range of 60% to 65%. Wall motion was normal; there were no  regional wall motion abnormalities. Left ventricular diastolic function parameters were normal. - Left atrium: Shadowing artifact on PSL axis images. - Atrial septum: No defect or patent foramen ovale was identified. Impressions:   No cardiac source of emboli was indentified.   PHYSICAL EXAM:  Stable today, he is more alert General - Well nourished, well developed, in no apparent distress.  Ophthalmologic - Fundi not visualized due to noncooperation.  Cardiovascular - Regular rate and rhythm.  Neuro - alert, global aphasia, minimal speech output limited to self only, did not follow commands. Right neglect, left gaze preference, but able to cross midline. Blinking to visual threat on the right, but not to the left. PERRL. Right facial droop, tongue in middle in mouth. Right hemiplegia, trace withdraw on right UE and 3-/5 RLE with pain stimulation. LUE and LLE spontaneous movement. Sensation, coordination and gait not tested.    ASSESSMENT/PLAN [name] is a 50 y.o. male with history of HTN found down in the bathroom. CT showed large left thalamic ICH.   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 No high grade stenosis  Repeat CT head remains stable  Carotid Doppler  No significant stenosis   2D Echo  EF 60-65%  LDL 157  HgbA1c 5.8  SCDs and subq heparin for VTE prophylaxis  Amantadine added for 7 days to improve arousal. Day # 5/7 DIET - DYS 1 Room service appropriate?: Yes; Fluid consistency:: Nectar Thick  No antithrombotic prior to admission  Ongoing aggressive stroke risk factor management  Therapy recommendations:  CIR. Admissions coordinator following  Disposition:  pending   Fever  Tmax 101.4  but WBC 9.8, Temp 99.9 Sunday  Could be due to aspiration   CXR normal  UA no infection  BCx - no growth x 2 days  Hold off Abx for now  UA was negative.  Blood cultures are in progress. CXR negative. Temp today is normal, WBCs normal.    Transient V-tach  Likely associated with ICH  EF 60-65%  Cardiology on board, appreciate recs  continue metoprolol  Hypertensive Emergency  BP as high as 215/120, 213/130 on arrival in setting of neuro changes  Remains On cleviprex drip  SBP goal < 160  home PO meds resumed - increase metoprolol to  bid, continue amlodipine  Qd and lisinopril  bid  Add hydralazine  Q8  Decrease IVF after TF increased  Continue to Wean off cleviprex as able  Secondary HTN work up so far negative.   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 Hart RochesterLawson, recommended a follow up as an outpatient in 4 weeks with a CT angiogram of abdomin and pelvis and to discuss further treatment.   Per EPIC, there has been no follow up  Will order CTA abd/pelvis once Cre normalized and stabilized - Cre 1.38->1.26->1.30->1.31 -> 1.44 - (Lisinopril may be contributing to renal dysfunction) Increase IV fluids and recheck Bmet. In AM  Other Active Problems  Hypokalemia 3.3 - supplement  Hospital day # 7  This patient with ICH with  IVH, hydrocephalus, hypertensive emergency, dysphagia. Repeat CT   stable . Increased BP meds for better BP control. Pt had fever. UA was negative.  Blood cultures negative. CXR negative. Temp today is normal, WBCs normal. encourage Po intake. Await rehab decision on disposition  Personally examined patient and images, and have participated in and made any corrections needed to history, physical, neuro exam,assessment and plan as stated above.  I have personally obtained the history, evaluated lab date, reviewed imaging studies and agree with radiology interpretations.   Delia HeadyPramod Sethi, MD Medical Director [facility] Pager: [phone] 08/27/2015 5:06 PM      To contact Stroke Continuity provider, please refer to [email or web address]. After hours, contact General Neurology

## 2015-08-27 NOTE — Progress Notes (Signed)
Cortrak feeding tube discconected this AM. Patient able to tolerated approximately 50% breakfast. Family at bedside wanting pt to take a break from NGT at this time. Stroke team paged, awaiting for order before notifying Cortrak team at this time.   Sim BoastHavy, RN

## 2015-08-27 NOTE — Progress Notes (Signed)
Physical Therapy Treatment Patient Details Name: Andre [name] MRN: [id number] DOB: 1965-06-27 Today's Date: 08/27/2015    History of Present Illness Pt admitted on 08/20/15 s/p large basal ganglia hemorrhage with extensive IVH    PT Comments    Progressing slowly with emphasis on sitting balance and drawing attention to the R side and following simple commands in general.  Follow Up Recommendations  CIR;Supervision/Assistance - 24 hour     Equipment Recommendations  Other (comment)    Recommendations for Other Services Rehab consult     Precautions / Restrictions Precautions Precautions: Fall    Mobility  Bed Mobility Overal bed mobility: Needs Assistance Bed Mobility: Rolling;Sidelying to Sit;Sit to Supine;Sit to Sidelying Rolling: Total assist Sidelying to sit: Total assist     Sit to sidelying: Total assist General bed mobility comments: Initially not alert enough to initiate even L side, but assisted getting into bed.  Transfers Overall transfer level: Needs assistance   Transfers: Sit to/from Stand;Lateral/Scoot Transfers Sit to Stand: Total assist        Lateral/Scoot Transfers: Total assist General transfer comment: Initited and assisted minimally with L LE.  Ambulation/Gait                 Stairs            Wheelchair Mobility    Modified Rankin (Stroke Patients Only) Modified Rankin (Stroke Patients Only) Pre-Morbid Rankin Score: No symptoms Modified Rankin: Severe disability     Balance Overall balance assessment: Needs assistance Sitting-balance support: No upper extremity supported;Feet supported Sitting balance-Leahy Scale: Poor Sitting balance - Comments: sat EOB for over 15 min working on truncal activation, assisting into/out of midline, balancing on elbows.     Standing balance-Leahy Scale: Zero                      Cognition Arousal/Alertness: Lethargic;Awake/alert Behavior During Therapy: Flat  affect Overall Cognitive Status: Difficult to assess Area of Impairment: Orientation;Attention;Memory;Following commands;Safety/judgement;Awareness;Problem solving   Current Attention Level: Sustained   Following Commands: Follows one step commands inconsistently;Follows one step commands with increased time Safety/Judgement: Decreased awareness of deficits Awareness: Intellectual Problem Solving: Slow processing;Decreased initiation;Difficulty sequencing General Comments: Acknowleges noises and touch to R side,  With repetitive physical and verbal cues pt can turn head, but not ayes past midline    Exercises      General Comments General comments (skin integrity, edema, etc.): Inattension to R side remains, severe L gaze preference.  Can get pt to minimally attend to input on R side.  Focus on sitting balance and attension      Pertinent Vitals/Pain Pain Assessment: Faces Faces Pain Scale: No hurt    Home Living                      Prior Function            PT Goals (current goals can now be found in the care plan section) Acute Rehab PT Goals Patient Stated Goal: none stated PT Goal Formulation: Patient unable to participate in goal setting Time For Goal Achievement: 09/04/15 Potential to Achieve Goals: Fair Progress towards PT goals: Progressing toward goals    Frequency  Min 4X/week    PT Plan Current plan remains appropriate    Co-evaluation             End of Session   Activity Tolerance: Patient tolerated treatment well Patient left: in bed;with call bell/phone within reach;with  nursing/sitter in room     Time: 1636-1710 PT Time Calculation (min) (ACUTE ONLY): 34 min  Charges:  $Therapeutic Activity: 8-22 mins $Neuromuscular Re-education: 8-22 mins                    G Codes:      Angelyne Terwilliger, Eliseo GumKenneth V 08/27/2015, 5:28 PM 08/27/2015  [place]bury BingKen Kilee Hedding, PT [phone]  (pager)

## 2015-08-27 NOTE — Progress Notes (Signed)
Nutrition Follow-up  DOCUMENTATION CODES:   Non-severe (moderate) malnutrition in context of chronic illness  INTERVENTION:  Provide Ensure Enlive po BID, each supplement provides 350 kcal and 20 grams of protein Provide Multivitamin with minerals daily Monitor PO intake for adequacy   NUTRITION DIAGNOSIS:   Inadequate oral intake related to inability to eat as evidenced by NPO status.  Ongoing; diet advanced, but PO remains inadequate at this time  GOAL:   Patient will meet greater than or equal to 90% of their needs  Unmet  MONITOR:   PO intake, Supplement acceptance, Labs, Weight trends, Skin, I & O's  REASON FOR ASSESSMENT:   Consult Enteral/tube feeding initiation and management  ASSESSMENT:   Pt with a history of HTN, ETOH abuse, ICH posterior pons and midbrain. Admitted after being found down, CT showed a large BG hemorrhage with extensive IVH.   Pt's diet was advanced to Dysphagia 1 with nectar-thick liquids on 6/23 afternoon. Tube feedings were discontinued this morning. Per nursing notes, pt ate 50% of breakfast and 75% of lunch today; 20-25% of meals on 6/24. Pt asleep at time of visit. Weight is up 2 lbs from admission weight.   Labs reviewed.   Diet Order:  DIET - DYS 1 Room service appropriate?: Yes; Fluid consistency:: Nectar Thick  Skin:  Reviewed, no issues  Last BM:  6/24  Height:   Ht Readings from Last 1 Encounters:  08/20/15 6' (1.829 m)    Weight:   Wt Readings from Last 1 Encounters:  08/27/15 150 lb 12.8 oz (68.402 kg)    Ideal Body Weight:  80.9 kg  BMI:  Body mass index is 20.45 kg/(m^2).  Estimated Nutritional Needs:   Kcal:  2000-2200  Protein:  100-115 grams  Fluid:  > 2 L/day  EDUCATION NEEDS:   No education needs identified at this time  Dorothea Ogleeanne Fredrick Dray RD, LDN Inpatient Clinical Dietitian Pager: [phone] After Hours Pager: [phone]

## 2015-08-27 NOTE — Progress Notes (Signed)
Stroke team ok for cortrak tube to be discontinued at this time.   Sim BoastHavy, RN

## 2015-08-27 NOTE — Clinical Social Work Note (Signed)
Clinical Social Work Assessment  Patient Details  Name: Andre [name] MRN: [id number] Date of Birth: May 27, 1965  Date of referral:  08/27/15               Reason for consult:  Facility Placement, Discharge Planning                Permission sought to share information with:  Family Supports, Customer service manager, Case Optician, dispensing granted to share information::  Yes, Verbal Permission Granted  Name::      Investment banker, corporate )  Agency::   ([place] [facility] vs [facility] )  Relationship::   (Mother )  Contact Information:   (H: [phone])  Housing/Transportation Living arrangements for the past 2 months:  Apartment Source of Information:  Parent Patient Interpreter Needed:  None Criminal Activity/Legal Involvement Pertinent to Current Situation/Hospitalization:  No - Comment as needed Significant Relationships:  Parents, Siblings, Other Family Members Lives with:  Self Do you feel safe going back to the place where you live?  No Need for family participation in patient care:  Yes (Comment)  Care giving concerns:  Patient will need short-term rehab at d/c.    Social Worker assessment / plan:  Holiday representative met with patient and family (mother, brother and other family members) present at bedside. CSW introduced CSW role and [facility] rehab vs SNF process. CSW provided VA SNF list. Pt's mother understands that referral has been sent to [place] in [place] and that [place] will approve/decline referral and make decision of which rehab setting patient is most appropriate for. Pt's mother reported that she lives in [place] and plans to travel back home tonight however would like to be present during d/c. CSW did inform pt's mother that if bed is available on tomorrow, 6/27 that patient will likely be d/c'ed and advised pt's mother to remain in the area if she wanted to be present during d/c. Pt's mother expressed understanding and stated she would make arrangements.   [facility]  referral made for rehab vs SNF with [facility]. [facility] Soc Worker, Arboriculturist contacted. No further concerns reported at this time. CSW will continue to follow pt and pt's family for continued support and to facilitate pt's d/c needs once medically stable.   Employment status:  Retired Forensic scientist:  [facility] Benefit PT Recommendations:  Inpatient [place] / Referral to community resources:  [place]  Patient/Family's Response to care:  Pt alert and disoriented x3. Pt's family agreeable to continued nursing care and rehab at [facility] or [place] rehab. Pt's family supportive and involved in pt's care.   Patient/Family's Understanding of and Emotional Response to Diagnosis, Current Treatment, and Prognosis: Unsure of pt's mother full understanding of pt's diagnosis and events leading to hospitalization.  Emotional Assessment Appearance:  Appears stated age Attitude/Demeanor/Rapport:  Unable to Assess Affect (typically observed):  Unable to Assess Orientation:  Oriented to Self Alcohol / Substance use:  Alcohol Use Psych involvement (Current and /or in the community):  No (Comment)  Discharge Needs  Concerns to be addressed:  Care Coordination Readmission within the last 30 days:  No Current discharge risk:  Dependent with Mobility, Cognitively Impaired, Lives alone Barriers to Discharge:  Continued Medical Work up, [employer], MSW, [place] [phone] 08/27/2015 2:44 PM

## 2015-08-27 NOTE — Clinical Social Work Note (Signed)
[place] MUST PASARR obtained: [id number].  Derenda FennelBashira Urias Sheek, MSW, LCSWA [phone] 08/27/2015 2:54 PM

## 2015-08-27 NOTE — Clinical Social Work Placement (Signed)
   CLINICAL SOCIAL WORK PLACEMENT  NOTE  Date:  08/27/2015  Patient Details  Name: Andre [name] MRN: [id number] Date of Birth: [birth year]  Clinical Social Work is seeking post-discharge placement for this patient at the Skilled  Nursing Facility level of care (*CSW will initial, date and re-position this form in  chart as items are completed):  No   Patient/family provided with [facility] Clinical Social Work Department's list of facilities offering this level of care within the geographic area requested by the patient (or if unable, by the patient's family).  Yes   Patient/family informed of their freedom to choose among providers that offer the needed level of care, that participate in Medicare, Medicaid or managed care program needed by the patient, have an available bed and are willing to accept the patient.  No   Patient/family informed of [facility]'s ownership interest in [facility] and [facility], as well as of the fact that they are under no obligation to receive care at these facilities.  PASRR submitted to EDS on 08/27/15     PASRR number received on 08/27/15     Existing PASRR number confirmed on       FL2 transmitted to all facilities in geographic area requested by pt/family on       FL2 transmitted to all facilities within larger geographic area on       Patient informed that his/her managed care company has contracts with or will negotiate with certain facilities, including the following:            Patient/family informed of bed offers received.  Patient chooses bed at       Physician recommends and patient chooses bed at      Patient to be transferred to   on  .  Patient to be transferred to facility by       Patient family notified on   of transfer.  Name of family member notified:        PHYSICIAN Please sign FL2     Additional Comment:    _______________________________________________ Andre [name], Andre Vangilder A, LCSW 08/27/2015, 2:48  PM

## 2015-08-27 NOTE — Progress Notes (Signed)
I met with pt's Mom and SW, Marjorie Smolder, at bedside to discuss rehab venue plans. Plans being made for [facility] or SNF. Mom lives in [place]. No bed available today on CIR today. [phone]

## 2015-08-28 DIAGNOSIS — G8100 Flaccid hemiplegia affecting unspecified side: Secondary | ICD-10-CM

## 2015-08-28 LAB — GLUCOSE, CAPILLARY
GLUCOSE-CAPILLARY: 110 mg/dL — AB (ref 65–99)
GLUCOSE-CAPILLARY: 92 mg/dL (ref 65–99)
GLUCOSE-CAPILLARY: 102 mg/dL — AB (ref 65–99)
Glucose-Capillary: 104 mg/dL — ABNORMAL HIGH (ref 65–99)

## 2015-08-28 NOTE — Progress Notes (Signed)
Sent for studies includingTROKE TEAM PROGRESS NOTE   SUBJECTIVE (INTERVAL HISTORY) Plans for discharge to skilled nursing facility today. Bed available. Patient stable.   OBJECTIVE Temp:  [99 F (37.2 C)-100.7 F (38.2 C)] 100.3 F (37.9 C) (06/27 1255) Pulse Rate:  [81-114] 81 (06/27 1300) Cardiac Rhythm:  [-] Normal sinus rhythm (06/27 0700) Resp:  [20-22] 20 (06/27 1255) BP: (162-184)/(91-111) 177/105 mmHg (06/27 1317) SpO2:  [98 %-99 %] 98 % (06/27 1255) Weight:  [66.18 kg (145 lb 14.4 oz)] 66.18 kg (145 lb 14.4 oz) (06/27 0500)  CBC:   Recent Labs Lab 08/24/15 0350 08/25/15 0544  WBC 9.8 9.0  HGB 14.6 14.1  HCT 43.3 41.5  MCV 85.9 86.5  PLT 298 291    Basic Metabolic Panel:   Recent Labs Lab 08/22/15 1308  08/25/15 0544 08/27/15 0643  NA 134*  < > 134* 147*  K 3.5  < > 3.5 3.9  CL 101  < > 101 116*  CO2 23  < > 23 25  GLUCOSE 118*  < > 125* 139*  BUN 18  < > 28* 43*  CREATININE 1.26*  < > 1.44* 1.71*  CALCIUM 8.9  < > 9.4 9.4  MG 2.1  --   --   --   < > = values in this interval not displayed.    IMAGING No results found.    PHYSICAL EXAM:  Stable today, he is more alert General - Well nourished, well developed, in no apparent distress.  Ophthalmologic - Fundi not visualized due to noncooperation.  Cardiovascular - Regular rate and rhythm.  Neuro - alert, global aphasia, minimal speech output limited to self only, did not follow commands. Right neglect, left gaze preference, but able to cross midline. Blinking to visual threat on the right, but not to the left. PERRL. Right facial droop, tongue in middle in mouth. Right hemiplegia, trace withdraw on right UE and 3-/5 RLE with pain stimulation. LUE and LLE spontaneous movement. Sensation, coordination and gait not tested.    ASSESSMENT/PLAN [name] is a 50 y.o. male with history of HTN found down in the bathroom. CT showed large left thalamic ICH.   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 No high grade stenosis  Repeat CT head remains stable  Carotid Doppler  No significant stenosis   2D Echo  EF 60-65%  LDL 157  HgbA1c 5.8  SCDs and subq heparin for VTE prophylaxis  Amantadine given for 7 days to improve arousal.  DIET - DYS 1 Room service appropriate?: Yes; Fluid consistency:: Nectar Thick  No antithrombotic prior to admission  Ongoing aggressive stroke risk factor management  Therapy recommendations:  CIR vs SNF. Social worker following  Disposition:  Pending. Medically ready for discharge when bed available.   Fever  Tmax 100.3 today  Could be due to aspiration   CXR normal  UA no infection  BCx - no growth  Hold off Abx for now  UA was negative.  Blood cultures negative. CXR negative.   Transient V-tach  Likely associated with ICH  EF 60-65%  Cardiology consulted  continue metoprolol  Hypertensive Emergency  BP as high as 215/120, 213/130 on arrival in setting of  neuro changes  Initially Treated with cleviprex drip  SBP goal < 160  home PO meds resumed - increase metoprolol to 75mg  bid, continue amlodipine 10mg  Qd and lisinopril 20mg  bid  Add hydralazine 50mg  Q8  Secondary HTN work up so far negative.   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 Hart RochesterLawson, recommended a follow up as an outpatient in 4 weeks with a CT angiogram of abdomin and pelvis and to discuss further treatment.   Per EPIC, there has been no follow up  Will order CTA abd/pelvis once Cre normalized and stabilized - Cre 1.38->1.26->1.30->1.31 -> 1.44 - 1.71  Other Active Problems  Hypokalemia, resolved 3.9  Hospital day # 8  BIBY,SHARON  [facility] See Amion for Pager information 08/28/2015 4:40 PM   I have personally examined this patient, reviewed notes, independently viewed imaging studies, participated in medical decision making and plan of care. I have made any additions or clarifications directly to the above note. Agree with note above.  Patient is medically stable for discharge to skilled nursing facility if bed available today  Delia HeadyPramod Adianna Darwin, MD Medical Director [facility] Pager: [phone] 08/28/2015 5:29 PM    To contact Stroke Continuity provider, please refer to [email or web address]. After hours, contact General Neurology

## 2015-08-28 NOTE — Clinical Social Work Note (Signed)
Clinical Social Worker [place] [facility] in regards to [facility] Velora HecklerQuiana reported that I will likely hear [facility] today between 1-3PM but no contact information provided to follow up with direct person regarding referral.   Medical Director advised CSW [facility] HUB [facility] to consider [facility]. CSW Director also aware of case.   CSW will continue to follow patient and pt's family for continued support and to facilitate pt's d/c needs once stable.   Derenda FennelBashira Emi Lymon, MSW, LCSWA [phone] 08/28/2015 11:57 AM

## 2015-08-28 NOTE — NC FL2 (Signed)
[place] MEDICAID FL2 LEVEL OF CARE SCREENING TOOL     IDENTIFICATION  Patient Name: Andre [name] Birthdate: October 26, 1965 Sex: male Admission Date (Current Location): 08/20/2015  [facility] [place]   Facility and Address:  [facility], [address], [place], [place] [address]      Provider Number: [id number]  Attending Physician Name and Address:  Micki RileyPramod S Sethi, MD  Relative Name and Phone Number:       Current Level of Care: Hospital Recommended Level of Care: Skilled Nursing Facility Prior Approval Number:    Date Approved/Denied:   PASRR Number: [id number]  Discharge Plan: SNF    Current Diagnoses: Patient Active Problem List   Diagnosis Date Noted  . IVH (intraventricular hemorrhage) (HCC)   . Hypertensive emergency   . Malignant hypertension   . Malnutrition of moderate degree 08/22/2015  . Nontraumatic subcortical hemorrhage of left cerebral hemisphere (HCC)   . Dysphagia, post-stroke   . Dysarthria, post-stroke   . Benign essential HTN   . Tobacco abuse   . Non-sustained ventricular tachycardia (HCC)   . Tachypnea   . Chronic combined systolic and diastolic heart failure (HCC)   . Flaccid hemiplegia affecting dominant side (HCC)   . ICH (intracerebral hemorrhage) (HCC) 06/11/2014    Orientation RESPIRATION BLADDER Height & Weight     Self  Normal Incontinent, External catheter Weight: 145 lb 14.4 oz (66.18 kg) Height:  6' (182.9 cm)  BEHAVIORAL SYMPTOMS/MOOD NEUROLOGICAL BOWEL NUTRITION STATUS   (NONE )  (NONE ) Incontinent Diet (DYS 1 Nector Thick )  AMBULATORY STATUS COMMUNICATION OF NEEDS Skin   Extensive Assist Non-Verbally Normal                       Personal Care Assistance Level of Assistance  Dressing, Feeding, Bathing, Total care Bathing Assistance: Maximum assistance Feeding assistance: Maximum assistance Dressing Assistance: Maximum assistance Total Care Assistance: Maximum  assistance   Functional Limitations Info  Speech, Hearing, Sight Sight Info: Adequate Hearing Info: Adequate Speech Info: Impaired    SPECIAL CARE FACTORS FREQUENCY  PT (By licensed PT), OT (By licensed OT)     PT Frequency: 4 OT Frequency: 2            Contractures      Additional Factors Info  Code Status, Allergies Code Status Info: FULL CODE  Allergies Info: N/A            Current Medications (08/28/2015):  This is the current hospital active medication list Current Facility-Administered Medications  Medication Dose Route Frequency Provider Last Rate Last Dose  . 0.9 %  sodium chloride infusion   Intravenous Continuous David L Rinehuls, PA-C 75 mL/hr at 08/28/15 0220    . acetaminophen (TYLENOL) tablet 650 mg  650 mg Oral Q4H PRN Rejeana BrockMcNeill P Kirkpatrick, MD   650 mg at 08/27/15 1623  . acetaminophen (TYLENOL) tablet 650 mg  650 mg Oral Q6H PRN Ileene Rubensonald C Steward, MD   650 mg at 08/28/15 0753  . amantadine (SYMMETREL) solution 150 mg  150 mg Oral BID Marvel PlanJindong Xu, MD   150 mg at 08/28/15 1109  . amLODipine (NORVASC) tablet 10 mg  10 mg Per Tube Daily Layne BentonSharon L Biby, NP   10 mg at 08/28/15 1108  . antiseptic oral rinse (CPC / CETYLPYRIDINIUM CHLORIDE 0.05%) solution 7 mL  7 mL Mouth Rinse BID Rejeana BrockMcNeill P Kirkpatrick, MD   7 mL at 08/28/15 1000  .  atorvastatin (LIPITOR) tablet 20 mg  20 mg Oral q1800 Marvel PlanJindong Xu, MD   20 mg at 08/27/15 1825  . feeding supplement (ENSURE ENLIVE) (ENSURE ENLIVE) liquid 237 mL  237 mL Oral BID BM Micki RileyPramod S Sethi, MD   237 mL at 08/28/15 1116  . heparin injection 5,000 Units  5,000 Units Subcutaneous Q8H Marvel PlanJindong Xu, MD   5,000 Units at 08/28/15 0650  . hydrALAZINE (APRESOLINE) tablet 50 mg  50 mg Oral Q8H Marvel PlanJindong Xu, MD   50 mg at 08/28/15 0650  . labetalol (NORMODYNE,TRANDATE) injection 10-40 mg  10-40 mg Intravenous Q10 min PRN Marvel PlanJindong Xu, MD   20 mg at 08/27/15 1102  . lisinopril (PRINIVIL,ZESTRIL) tablet 20 mg  20 mg Per Tube BID Layne BentonSharon L Biby, NP    20 mg at 08/28/15 1109  . metoprolol (LOPRESSOR) tablet 100 mg  100 mg Oral BID Micki RileyPramod S Sethi, MD   100 mg at 08/28/15 1108  . pantoprazole sodium (PROTONIX) 40 mg/20 mL oral suspension 40 mg  40 mg Per Tube Daily Marvel PlanJindong Xu, MD   40 mg at 08/28/15 1108  . senna-docusate (Senokot-S) tablet 1 tablet  1 tablet Oral BID Rejeana BrockMcNeill P Kirkpatrick, MD   1 tablet at 08/28/15 1109     Discharge Medications: Please see discharge summary for a list of discharge medications.  Relevant Imaging Results:  Relevant Lab Results:   Additional Information SSN [id number]  Derenda FennelBashira Nixon, MSW, Theresia MajorsLCSWA [phone] 08/28/2015 11:51 AM   I have personally examined this patient, reviewed notes,  participated in medical decision making and plan of care. I have made any additions or clarifications directly to the above note. Agree with note above.   Delia HeadyPramod Sethi, MD Medical Director [facility] Pager: [phone] 08/28/2015 6:09 PM

## 2015-08-28 NOTE — Progress Notes (Signed)
Physical Therapy Treatment Patient Details Name: Andre [name] MRN: [id number] DOB: 07/04/1965 Today's Date: 08/28/2015    History of Present Illness Pt admitted on 08/20/15 s/p large basal ganglia hemorrhage with extensive IVH    PT Comments    Progressing slowly.  Emphasis on standing and sitting balance/ tolerance   Follow Up Recommendations  CIR;Supervision/Assistance - 24 hour     Equipment Recommendations  Other (comment)    Recommendations for Other Services       Precautions / Restrictions Precautions Precautions: Fall Precaution Comments: R neglect    Mobility  Bed Mobility Overal bed mobility: Needs Assistance Bed Mobility: Rolling;Sidelying to Sit;Sit to Sidelying Rolling: Max assist Sidelying to sit: Max assist;+2 for physical assistance     Sit to sidelying: Total assist;+2 for physical assistance General bed mobility comments: With cues to help initiate, pt assist to push up from R sidelying with L UE  Transfers Overall transfer level: Needs assistance   Transfers: Sit to/from Stand Sit to Stand: Max assist;+2 physical assistance Stand pivot transfers: Max assist;+2 physical assistance       General transfer comment: pt reached for chair back and help come forward and up minimally with L side, but need significant to come forward and boost up.  Ambulation/Gait                 Stairs            Wheelchair Mobility    Modified Rankin (Stroke Patients Only) Modified Rankin (Stroke Patients Only) Pre-Morbid Rankin Score: No symptoms Modified Rankin: Severe disability     Balance Overall balance assessment: Needs assistance Sitting-balance support: Single extremity supported Sitting balance-Leahy Scale: Poor Sitting balance - Comments: sat EOB ~5 min before and after standing trials working on balance in midline.  Noted much improvement hoding midline after standing trials.   Standing balance support: Bilateral upper  extremity supported Standing balance-Leahy Scale: Zero Standing balance comment: stood x3 with bilateral assist and support at knees, lower and upper trunk.  Subtle attempts to control R knee by pt noted.  Pt had difficulty maintain upright posture.                    Cognition Arousal/Alertness: Awake/alert Behavior During Therapy: Flat affect Overall Cognitive Status: Impaired/Different from baseline Area of Impairment: Orientation;Attention;Memory;Following commands;Safety/judgement;Awareness;Problem solving Orientation Level: Disoriented to;Place;Time Current Attention Level: Sustained Memory: Decreased short-term memory Following Commands: Follows one step commands with increased time Safety/Judgement: Decreased awareness of safety;Decreased awareness of deficits Awareness: Intellectual Problem Solving: Slow processing;Decreased initiation;Difficulty sequencing;Requires verbal cues;Requires tactile cues General Comments: Increased scanning into R field with repetitave vc and tactile cues. Pt places so TV in R visual field, requiring pt to look R to see TV. Poor  problem solving    Exercises      General Comments General comments (skin integrity, edema, etc.): Significant R inattension remains.  pt can turn his head R and scan past midline to the R with repetitive visual, tactile and verbal cues.      Pertinent Vitals/Pain Pain Assessment: Faces Faces Pain Scale: No hurt    Home Living                      Prior Function            PT Goals (current goals can now be found in the care plan section) Acute Rehab PT Goals Patient Stated Goal: none stated PT Goal Formulation: Patient unable  to participate in goal setting Time For Goal Achievement: 09/04/15 Potential to Achieve Goals: Fair Progress towards PT goals: Progressing toward goals    Frequency  Min 4X/week    PT Plan Current plan remains appropriate    Co-evaluation             End of  Session   Activity Tolerance: Patient tolerated treatment well Patient left: in bed;with call bell/phone within reach;with nursing/sitter in room     Time: 1515-1550 PT Time Calculation (min) (ACUTE ONLY): 35 min  Charges:  $Therapeutic Activity: 23-37 mins                    G Codes:      Rodman Recupero, Eliseo GumKenneth V 08/28/2015, 6:04 PM 08/28/2015  [place] BingKen Shaquilla Kehres, PT [phone]  (pager)

## 2015-08-28 NOTE — Progress Notes (Signed)
I do not have an inpt rehab bed available to admit this pt to today. RN CM and SW are aware. [phone]

## 2015-08-28 NOTE — Progress Notes (Signed)
Occupational Therapy Treatment Patient Details Name: Andre [name] MRN: [id number] DOB: [birth year] Today's Date: 08/28/2015    History of present illness Pt admitted on 08/20/15 s/p large basal ganglia hemorrhage with extensive IVH   OT comments  Pt with improved alertness and ability to participate. Flat affect with apparent R neglect. No voluntary movement observed with RUE. Unable to maintain midline orientation when sitting EOB. Focus of session on trunk control, midline orientation and increasing attention to R body/environment during functional tasks. Pt will need extensive rehab and would benefit from rehab at CIR. Recommend nsg use maximove to mobilize pt back to bed.   Follow Up Recommendations  CIR;Supervision/Assistance - 24 hour    Equipment Recommendations  Other (comment)    Recommendations for Other Services Rehab consult    Precautions / Restrictions Precautions Precautions: Fall Precaution Comments: R neglect       Mobility Bed Mobility Overal bed mobility: Needs Assistance Bed Mobility: Supine to Sit;Rolling Rolling: Max assist Sidelying to sit: Mod assist;HOB elevated       General bed mobility comments: Pt not initiating movemetn , however, used hand over hand to grasp rail, then pt attempted to push up to EOB with mod A. LLE opped under RLE to help transition legs off bed.   Transfers Overall transfer level: Needs assistance   Transfers: Sit to/from Stand;Stand Pivot Transfers Sit to Stand: Max assist;+2 physical assistance (unableto achieve full upright posture) Stand pivot transfers: Max assist;+2 physical assistance       General transfer comment: attempted to help with transitional movements after intitiation    Balance     Sitting balance-Leahy Scale: Poor Sitting balance - Comments: R lateral lean. unable to maintain midline orientation EOB     Standing balance-Leahy Scale: Zero                     ADL Overall ADL's :  Needs assistance/impaired     Grooming: Moderate assistance   Upper Body Bathing: Moderate assistance    Hand over  Hand to initiate rubbing lotion on RLE using L hand. Mod vc to attend to task. Facilitating trunk control and wt bearing throguh RUE when crossing midline and leaning over to touch RLE with LUE. Once task initiated, pt able to complete with min A and moderate redirection.         Able to wash face with set up. To wash hands, pt required mod vc to locate his R UE               Functional mobility during ADLs: +2 for physical assistance;Maximal assistance (Transfer toward L side.       Vision                     Perception  R neglect. Will further assess   Praxis  poor initiation. Will further assess    Cognition   Behavior During Therapy: Flat affect Overall Cognitive Status: Impaired/Different from baseline Area of Impairment: Orientation;Attention;Memory;Following commands;Safety/judgement;Awareness;Problem solving Orientation Level: Disoriented to;Place;Time Current Attention Level: Sustained Memory: Decreased short-term memory  Following Commands: Follows one step commands with increased time Safety/Judgement: Decreased awareness of safety;Decreased awareness of deficits Awareness: Intellectual Problem Solving: Slow processing;Decreased initiation;Difficulty sequencing;Requires verbal cues;Requires tactile cues General Comments: Increased scanning into R field with repetitave vc and tactile cues. Pt places so TV in R visual field, requiring pt to look R to see TV. Poor  problem solving    Extremity/Trunk Assessment  Exercises  Weight bearing through RUE during functional tasks   Shoulder Instructions       General Comments      Pertinent Vitals/ Pain       Pain Assessment: Faces Faces Pain Scale: No hurt  Home Living                                          Prior Functioning/Environment               Frequency Min 2X/week     Progress Toward Goals  OT Goals(current goals can now be found in the care plan section)  Progress towards OT goals: Progressing toward goals  Acute Rehab OT Goals Patient Stated Goal: none stated OT Goal Formulation: Patient unable to participate in goal setting Time For Goal Achievement: 09/05/15 Potential to Achieve Goals: Fair ADL Goals Pt Will Perform Grooming: with min assist;sitting Pt Will Perform Upper Body Bathing: with min assist;sitting Pt Will Perform Lower Body Bathing: with max assist;sit to/from stand Pt Will Transfer to Toilet: with max assist;stand pivot transfer;bedside commode Pt Will Perform Toileting - Clothing Manipulation and hygiene: with max assist;sit to/from stand Additional ADL Goal #1: Pt will identifiy 3/5 objects during funcitonal task in R visual field with min vc in nondistracting environment  Plan Discharge plan remains appropriate    Co-evaluation                 End of Session Equipment Utilized During Treatment: Gait belt   Activity Tolerance Patient tolerated treatment well   Patient Left in chair;with call bell/phone within reach;with chair alarm set   Nurse Communication Mobility status        Time: [id number] OT Time Calculation (min): 32 min  Charges: OT General Charges $OT Visit: 1 Procedure OT Treatments $Self Care/Home Management : 23-37 mins  Anuja Manka,HILLARY 08/28/2015, 3:42 PM   [facility] Juwon Scripter, OTR/L  [phone] 08/28/2015

## 2015-08-28 NOTE — Clinical Social Work Note (Signed)
Clinical Child psychotherapistocial Worker [place] [facility] indicating that patient has a significantly poor potential for [facility]. Soc Worker reported that family needs to initiate long-term Medicaid application process. Information relayed to   Soc Worker approved patient for 90 days at a contracted VA SNF. CSW confirmed SNF's in contract with VA. There are only 3 SNF's that have been approved by VA:   1. [facility]/Admissions Director: Rosalie Gumsiffany Malay [phone] 2. [facility]/Admissions Director: Ulyses Southwarduth King [phone] 3. [facility]/Admissions Director:  Burke KeelsKatie Mabe [phone] (currently does not have male beds, however anticipates male bed opening tomorrow, 6/28)  Please see the following for VA contacts:   1. Ranita Bland (BEST CONTACT) Phone: [phone]  2. Benedetto GoadJohn Evans or Janae Bridgemanonya Davis  Phone: [phone]  Patient has also been approved by Medical Director and CSW Director for 30 Letter of Guarantee (LOG) [facility]. Referral made and facility aware that patient will likely d/c tomorrow, 6/28.   Patient's mother is patient's main contact and has been informed of all of the above. [place] to [place], [place] and will need to be notified as soon as possible of d/c plans so that she is able to make travel arrangements.   RNCM, CSW PhotographerDirector and Medical Director aware of plan.   CSW will continue to follow patient and pt's family for continued support and to facilitate pt's d/c needs once d/c'ed.   Derenda FennelBashira Remedy Corporan, MSW, LCSWA [phone] 08/28/2015 4:52 PM

## 2015-08-29 ENCOUNTER — Inpatient Hospital Stay (HOSPITAL_COMMUNITY): Payer: Non-veteran care

## 2015-08-29 DIAGNOSIS — E785 Hyperlipidemia, unspecified: Secondary | ICD-10-CM | POA: Diagnosis present

## 2015-08-29 DIAGNOSIS — I610 Nontraumatic intracerebral hemorrhage in hemisphere, subcortical: Secondary | ICD-10-CM | POA: Diagnosis not present

## 2015-08-29 DIAGNOSIS — I714 Abdominal aortic aneurysm, without rupture, unspecified: Secondary | ICD-10-CM | POA: Diagnosis present

## 2015-08-29 DIAGNOSIS — E871 Hypo-osmolality and hyponatremia: Secondary | ICD-10-CM | POA: Diagnosis not present

## 2015-08-29 DIAGNOSIS — E876 Hypokalemia: Secondary | ICD-10-CM | POA: Diagnosis not present

## 2015-08-29 DIAGNOSIS — Z8679 Personal history of other diseases of the circulatory system: Secondary | ICD-10-CM

## 2015-08-29 LAB — BASIC METABOLIC PANEL
ANION GAP: 9 (ref 5–15)
Anion gap: 7 (ref 5–15)
BUN: 40 mg/dL — ABNORMAL HIGH (ref 6–20)
BUN: 44 mg/dL — ABNORMAL HIGH (ref 6–20)
CALCIUM: 10.2 mg/dL (ref 8.9–10.3)
CHLORIDE: 117 mmol/L — AB (ref 101–111)
CO2: 25 mmol/L (ref 22–32)
CO2: 26 mmol/L (ref 22–32)
Calcium: 9.7 mg/dL (ref 8.9–10.3)
Chloride: 116 mmol/L — ABNORMAL HIGH (ref 101–111)
Creatinine, Ser: 1.66 mg/dL — ABNORMAL HIGH (ref 0.61–1.24)
Creatinine, Ser: 1.77 mg/dL — ABNORMAL HIGH (ref 0.61–1.24)
GFR calc Af Amer: 54 mL/min — ABNORMAL LOW (ref >60)
GFR calc non Af Amer: 43 mL/min — ABNORMAL LOW (ref >60)
GFR, EST AFRICAN AMERICAN: 50 mL/min — AB (ref >60)
GFR, EST NON AFRICAN AMERICAN: 47 mL/min — AB (ref >60)
GLUCOSE: 118 mg/dL — AB (ref 65–99)
Glucose, Bld: 118 mg/dL — ABNORMAL HIGH (ref 65–99)
POTASSIUM: 4.1 mmol/L (ref 3.5–5.1)
POTASSIUM: 4.1 mmol/L (ref 3.5–5.1)
SODIUM: 150 mmol/L — AB (ref 135–145)
SODIUM: 150 mmol/L — AB (ref 135–145)

## 2015-08-29 LAB — GLUCOSE, CAPILLARY
GLUCOSE-CAPILLARY: 117 mg/dL — AB (ref 65–99)
GLUCOSE-CAPILLARY: 93 mg/dL (ref 65–99)
Glucose-Capillary: 113 mg/dL — ABNORMAL HIGH (ref 65–99)

## 2015-08-29 LAB — CULTURE, BLOOD (ROUTINE X 2)
Culture: NO GROWTH
Culture: NO GROWTH

## 2015-08-29 LAB — CBC
HEMATOCRIT: 43.4 % (ref 39.0–52.0)
HEMOGLOBIN: 13.9 g/dL (ref 13.0–17.0)
MCH: 29.9 pg (ref 26.0–34.0)
MCHC: 32 g/dL (ref 30.0–36.0)
MCV: 93.3 fL (ref 78.0–100.0)
Platelets: 335 10*3/uL (ref 150–400)
RBC: 4.65 MIL/uL (ref 4.22–5.81)
RDW: 13.9 % (ref 11.5–15.5)
WBC: 9.7 10*3/uL (ref 4.0–10.5)

## 2015-08-29 MED ORDER — HYDRALAZINE HCL 50 MG PO TABS
100.0000 mg | ORAL_TABLET | Freq: Three times a day (TID) | ORAL | Status: DC
  Administered 2015-08-29 – 2015-08-31 (×7): 100 mg via ORAL
  Filled 2015-08-29 (×7): qty 2

## 2015-08-29 MED ORDER — HYDRALAZINE HCL 100 MG PO TABS: 100.0000 mg | ORAL_TABLET | Freq: Three times a day (TID) | ORAL | Status: AC

## 2015-08-29 MED ORDER — SENNOSIDES-DOCUSATE SODIUM 8.6-50 MG PO TABS: 1.0000 | ORAL_TABLET | Freq: Two times a day (BID) | ORAL | Status: AC

## 2015-08-29 MED ORDER — AMANTADINE HCL 50 MG/5ML PO SYRP: 150.0000 mg | ORAL_SOLUTION | Freq: Two times a day (BID) | ORAL | Status: AC

## 2015-08-29 MED ORDER — METOPROLOL TARTRATE 100 MG PO TABS: 100.0000 mg | ORAL_TABLET | Freq: Two times a day (BID) | ORAL | Status: AC

## 2015-08-29 MED ORDER — PIPERACILLIN-TAZOBACTAM 3.375 G IVPB 30 MIN
3.3750 g | Freq: Once | INTRAVENOUS | Status: AC
  Administered 2015-08-29: 3.375 g via INTRAVENOUS
  Filled 2015-08-29: qty 50

## 2015-08-29 NOTE — Progress Notes (Signed)
Paged night shift neuro. Pt will not be going to [facility] tonight. Will do further work up. Will make [facility] aware

## 2015-08-29 NOTE — Clinical Social Work Note (Signed)
Patient to be d/c'ed today to [facility].  Patient and family agreeable to plans will transport via ems RN to call report to room [phone].  Windell MouldingEric Dede Dobesh, MSW, Theresia MajorsLCSWA [phone]

## 2015-08-29 NOTE — Progress Notes (Signed)
Brother at bedside, given update. pts mother is on her way to finalize paperwork with social work. rn gave brother nectar thick orange juice with green swab to swab pts mouth and give him some oj. Pt nodded when brother asked if he liked the oj.

## 2015-08-29 NOTE — Clinical Social Work Note (Addendum)
CSW contacted [facility] Burke KeelsKatie Mabe [phone] and they can not take patient, CSW received phone call from Beverely LowJeannie [phone] at [facility] who requested clinicals to be faxed to SNF so they can review.  CSW contacted [facility] Rosalie Gumsiffany Malay [phone] and [facility] Ulyses SouthwardRuth King [phone] and left message awaiting call back from facilities to see if they can accept patient.  CSW continuing to follow patient's progress throughout discharge planning.  Ervin KnackEric R. Carmichael Burdette, MSW, LCSWA [phone] 08/29/2015 11:41 AM

## 2015-08-29 NOTE — Progress Notes (Addendum)
rn checked pts temperature, elevated. Called neuro md, who said to give tylenol and check temp again before [facility]AR arrived to transport pt to [facility]. RN had already called and given report to Tawan, RN   rn had previously discontinued tele, but IV has not been removed.

## 2015-08-29 NOTE — Discharge Summary (Addendum)
Stroke Discharge Summary  Patient ID: Andre [name]   MRN: [id number]      DOB: 1966-01-17  Date of Admission: 08/20/2015 Date of Discharge: 08/31/2015  Attending Physician:  Micki Riley, MD, Stroke MD Consulting Physician(s):  Lance Muss, MD (cardiology), Maryla Morrow, MD (Physical Medicine & Rehabtilitation)  Patient's PCP:  No primary care provider on file.  DISCHARGE DIAGNOSIS:  Principal Problem:   Nontraumatic subcortical hemorrhage of left cerebral hemisphere [facility]) Active Problems:   Dysphagia, post-stroke   Dysarthria, post-stroke   Benign essential HTN   Tobacco abuse   Non-sustained ventricular tachycardia (HCC)   Tachypnea   Flaccid hemiplegia affecting dominant side (HCC)   Malnutrition of moderate degree   IVH (intraventricular hemorrhage) (HCC)   Hypertensive emergency   Malignant hypertension   Hyponatremia   Hyperlipidemia   History of intracranial hemorrhage   AAA (abdominal aortic aneurysm) (HCC)   Hypokalemia   Past Medical History  Diagnosis Date  . Hypertension    Past Surgical History  Procedure Laterality Date  . Appendectomy    . Abdominal surgery        Medication List    TAKE these medications        amantadine 50 MG/5ML solution  Commonly known as:  SYMMETREL  Take 15 mLs (150 mg total) by mouth 2 (two) times daily.     amLODipine 10 MG tablet  Commonly known as:  NORVASC  Take 1 tablet (10 mg total) by mouth daily.     atorvastatin 20 MG tablet  Commonly known as:  LIPITOR  Take 1 tablet (20 mg total) by mouth daily at 6 PM.     hydrALAZINE 100 MG tablet  Commonly known as:  APRESOLINE  Take 1 tablet (100 mg total) by mouth every 8 (eight) hours.     lisinopril 20 MG tablet  Commonly known as:  PRINIVIL,ZESTRIL  Take 1 tablet (20 mg total) by mouth 2 (two) times daily.     metoprolol 100 MG tablet  Commonly known as:  LOPRESSOR  Take 1 tablet (100 mg total) by mouth 2 (two) times daily.      senna-docusate 8.6-50 MG tablet  Commonly known as:  Senokot-S  Take 1 tablet by mouth 2 (two) times daily.        LABORATORY STUDIES CBC    Component Value Date/Time   WBC 8.0 08/31/2015 0641   RBC 4.67 08/31/2015 0641   HGB 13.7 08/31/2015 0641   HCT 43.8 08/31/2015 0641   PLT 310 08/31/2015 0641   MCV 93.8 08/31/2015 0641   MCH 29.3 08/31/2015 0641   MCHC 31.3 08/31/2015 0641   RDW 13.6 08/31/2015 0641   LYMPHSABS 0.9 08/20/2015 0205   MONOABS 0.4 08/20/2015 0205   EOSABS 0.0 08/20/2015 0205   BASOSABS 0.0 08/20/2015 0205   CMP    Component Value Date/Time   NA 151* 08/31/2015 0641   K 3.9 08/31/2015 0641   CL 118* 08/31/2015 0641   CO2 24 08/31/2015 0641   GLUCOSE 110* 08/31/2015 0641   BUN 45* 08/31/2015 0641   CREATININE 1.85* 08/31/2015 0641   CALCIUM 10.2 08/31/2015 0641   PROT 7.8 08/20/2015 0205   ALBUMIN 3.9 08/20/2015 0205   AST 20 08/20/2015 0205   ALT 13* 08/20/2015 0205   ALKPHOS 63 08/20/2015 0205   BILITOT 0.9 08/20/2015 0205   GFRNONAA 41* 08/31/2015 0641   GFRAA 48* 08/31/2015 0641   COAGS Lab Results  Component Value  Date   INR 0.99 08/20/2015   INR 0.92 06/11/2014   Lipid Panel    Component Value Date/Time   CHOL 243* 08/21/2015 0947   TRIG 206* 08/21/2015 0947   HDL 45 08/21/2015 0947   CHOLHDL 5.4 08/21/2015 0947   VLDL 41* 08/21/2015 0947   LDLCALC 157* 08/21/2015 0947   HgbA1C  Lab Results  Component Value Date   HGBA1C 5.8* 08/21/2015   Urinalysis    Component Value Date/Time   COLORURINE YELLOW 08/30/2015 1344   APPEARANCEUR CLEAR 08/30/2015 1344   LABSPEC 1.020 08/30/2015 1344   PHURINE 5.5 08/30/2015 1344   GLUCOSEU NEGATIVE 08/30/2015 1344   HGBUR NEGATIVE 08/30/2015 1344   BILIRUBINUR NEGATIVE 08/30/2015 1344   KETONESUR NEGATIVE 08/30/2015 1344   PROTEINUR 100* 08/30/2015 1344   UROBILINOGEN 0.2 06/11/2014 0410   NITRITE NEGATIVE 08/30/2015 1344   LEUKOCYTESUR NEGATIVE 08/30/2015 1344   Urine Drug Screen      Component Value Date/Time   LABOPIA NONE DETECTED 08/21/2015 0658   COCAINSCRNUR NONE DETECTED 08/21/2015 0658   LABBENZ NONE DETECTED 08/21/2015 0658   AMPHETMU NONE DETECTED 08/21/2015 0658   THCU NONE DETECTED 08/21/2015 0658   LABBARB NONE DETECTED 08/21/2015 0658    SIGNIFICANT DIAGNOSTIC STUDIES Ct Angio Head W/cm &/or Wo Cm 08/20/2015 ADDENDUM: Additional observations with this study: The acute left thalamic parenchymal hemorrhage is not significantly changed in size relative to previous exam from earlier today, measuring 19 x 32 x 32 mm (estimated volume 9.7 cc). Similar localized vasogenic edema and mass effect. Intraventricular extension with the preponderance of intraventricular blood within the left lateral ventricle is similar, although smaller amounts of blood are present within the right lateral ventricle, third ventricle, with subsequent extension through the cerebral aqueduct into the fourth ventricle, stable. Localized left-to-right shift of approximately 5 mm and ventricular dilatation is also 1. Mild atheromatous irregularity within the cavernous ICAs bilaterally without significant stenosis. 2. Otherwise negative CTA of the neck. No high-grade or correctable stenosis. No large vessel occlusion. No aneurysm or vascular malformation.   Ct Head Wo Contrast 08/24/2015  LEFT thalamic hematoma is stable. Mild hydrocephalus, without significant worsening.  08/22/2015 Evolving LEFT thalamus hematoma with intraventricular extension. Stable mild hydrocephalus and interstitial edema. Stable 5 mm LEFT-to-RIGHT midline shift. 08/21/2015 Evolving LEFT thalamus hematoma with intraventricular extension. Mild hydrocephalus and periventricular hypodensity/interstitial edema with mild RIGHT ventricular entrapment. Stable 5 mm LEFT-to-RIGHT midline shift.  08/20/2015 Left thalamic hemorrhage with extension into the intraventricular system and approximately 5 mm left-to-right midline shift.    Carotid Doppler  There is 1-39% bilateral ICA stenosis. Vertebral artery flow is antegrade.   TTE - Left ventricle: The cavity size was normal. Wall thickness was normal. Systolic function was normal. The estimated ejection fraction was in the range of 60% to 65%. Wall motion was normal; there were no regional wall motion abnormalities. Left ventricular diastolic function parameters were normal. - Left atrium: Shadowing artifact on PSL axis images. - Atrial septum: No defect or patent foramen ovale was identified. Impressions: No cardiac source of emboli was indentified.  Dg Chest Port 1 View 08/24/2015  No active disease. Electronically Signed   By: Charlett Nose M.D.   On: 08/24/2015 14:30  08/21/2015  Mild cardiac enlargement.  No acute pulmonary findings. Electronically Signed   By: Rudie Meyer M.D.   On: 08/21/2015 20:56      HISTORY OF PRESENT ILLNESS BUD KAESER is a 50 y.o. male with a history of htn who  was found in the bathroom in his own vomit. He was brought to the ER via EMS where a head CT was obtained which showed a large BG hemorrhage with extensive IVH. His LKW is unclear. ICH Score: 2. Patient was not administered IV t-PA secondary to ICH. He was admitted to the neuro ICU for further evaluation and treatment.   HOSPITAL COURSE Mr. Zenovia Jarredheodore W Weirauch is a 50 y.o. male with history of HTN found down in the bathroom. CT showed large left thalamic ICH. ICH secondary to hypertensive emergency. BP treated with cleviprex, transitioned to PO, though BP still elevated. Had transient VT associated with ICH, started on metoprolol. Lethargy post ICH improved on 7d course of amantadine. Has  Infra-renal fusiform aneurysm of AAA and bilateral renal cysts identified during previous hospitalization. For [facility] follow up but needs CTA. Unable to complete due to elevated Cr. Disposition difficult due, SW pursuing SNF placement.  Stroke: Dominant left thalamic hemorrhage w/ IVH and  cerebral edema, secondary to hypertensive emergency  CT L thalamic hmg with IVH  Repeat CT evolving L thalamic hmg w/ IVH with hydrocephalus, mild  CTA head L thalamic hmg 9.97ml, localized vasogenic edema and mass effect, 5mm L to R shift, IVH. No high grade stenosis  Repeat CT head remains stable  Carotid Doppler No significant stenosis   2D Echo EF 60-65%  LDL 157  HgbA1c 5.8  Amantadine given for 7 days to improve arousal. (D#6/7 day of discharge)   No antithrombotic prior to admission  Ongoing aggressive stroke risk factor management  Therapy recommendations: CIR vs SNF. Social worker following  Disposition: Pending. Medically ready for discharge when bed available.  Fever  Developed TMax 101.7 two days ago, now resolved  Discharge held due to workup  UA was negative. Blood cultures negative. CXR negative. WBC normal  Received 1 dose zosyn without indication to continue  Could be due to aspiration vs ICH. Treat symptoms   No source infection identified  Transient V-tach  Likely associated with ICH  EF 60-65%  Cardiology consulted  continue metoprolol  Hypertensive Emergency  BP as high as 215/120, 213/130 on arrival in setting of neuro changes  Initially Treated with cleviprex drip  SBP goal < 160  On metoprolol to 100mg  bid, continue amlodipine 10mg  Qd and lisinopril 20mg  bid  Increased hydralazine to 100 mg Q8  Secondary HTN work up so far negative.  Hyperlipidemia  Home meds: lipitor 20  LDL 157, goal < 70  Resume lipitor 20  Continue statin on discharge  Hyponatremia   Mild 133->134->135->147->152->151  On salt tablet 2g tid which were discontinued 6/26  Starting to decrease. 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 Hart RochesterLawson, recommended a follow up as an outpatient in 4 weeks with a CT angiogram of abdomin and pelvis and to discuss further treatment.   Per EPIC, there has been no follow up  Consider CTA abd/pelvis  as an OP once Cre normalized and stabilized - 1.77  Other Active Problems  Hypokalemia, resolved 4.1   DISCHARGE EXAM Blood pressure 160/97, pulse 105, temperature 99.6 F (37.6 C), temperature source Oral, resp. rate 16, height 6' (1.829 m), weight 63.73 kg (140 lb 8 oz), SpO2 94 %. Stable today, he is more alert General - Well nourished, well developed, in no apparent distress.  Ophthalmologic - Fundi not visualized due to noncooperation.  Cardiovascular - Regular rate and rhythm.  Neuro - alert, global aphasia, minimal speech output limited to self only, did not follow commands. Right neglect, left gaze preference, but able to cross midline. Blinking to visual threat on the right, but not to the left. PERRL. Right facial droop, tongue in middle in mouth. Right hemiplegia, trace withdraw on right UE and 3-/5 RLE with pain stimulation. LUE and LLE spontaneous movement. Sensation, coordination and gait not tested.   Discharge Diet   DIET DYS 3 Room service appropriate?: Yes; Fluid consistency:: Nectar Thick liquids  DISCHARGE PLAN  Disposition:  Discharge to skilled nursing facility for ongoing PT, OT and ST.   Due to hemorrhage and risk of bleeding, do not take aspirin, aspirin-containing medications, or ibuprofen products   Ongoing risk factor control by Primary Care Physician at time of discharge  Follow-up No primary care provider  on file. in 2 weeks.  Follow-up with Dr. Delia HeadyPramod Sethi, Stroke Clinic in 2 months, office to schedule an appointment.  45 minutes were spent preparing discharge.  Rhoderick MoodyBIBY,SHARON  [facility] See Amion for Pager information 08/31/2015 2:10 PM  I have personally examined this patient, reviewed notes, independently viewed imaging studies, participated in medical decision making and plan of care. I have made any additions or clarifications directly to the above note. Agree with note above. The patient has low-grade fever but has had extensive workup with negative chest x-ray, UA, blood culture and normal WBC. Perhaps the cause of the fever is his brain hemorrhage  Delia HeadyPramod Sethi, MD Medical Director [facility] Pager: [phone] 08/31/2015 5:04 PM

## 2015-08-29 NOTE — Progress Notes (Signed)
Sent for studies includingTROKE TEAM PROGRESS NOTE   SUBJECTIVE (INTERVAL HISTORY) BP up slightly. Has been on increased dose of metoprolol now for 2 days. Also, NA rising. Off salt tablets. Has IVF.   OBJECTIVE Temp:  [99 F (37.2 C)-100.3 F (37.9 C)] 99 F (37.2 C) (06/28 0938) Pulse Rate:  [80-123] 80 (06/28 0938) Cardiac Rhythm:  [-] Normal sinus rhythm (06/28 0740) Resp:  [20-21] 20 (06/28 0938) BP: (170-194)/(101-132) 174/101 mmHg (06/28 0938) SpO2:  [95 %-100 %] 100 % (06/28 0938) Weight:  [66.906 kg (147 lb 8 oz)] 66.906 kg (147 lb 8 oz) (06/28 0500)  CBC:   Recent Labs Lab 08/24/15 0350 08/25/15 0544  WBC 9.8 9.0  HGB 14.6 14.1  HCT 43.3 41.5  MCV 85.9 86.5  PLT 298 291    Basic Metabolic Panel:   Recent Labs Lab 08/22/15 1308  08/27/15 0643 08/29/15 0927  NA 134*  < > 147* 150*  K 3.5  < > 3.9 4.1  CL 101  < > 116* 116*  CO2 23  < > 25 25  GLUCOSE 118*  < > 139* 118*  BUN 18  < > 43* 40*  CREATININE 1.26*  < > 1.71* 1.66*  CALCIUM 8.9  < > 9.4 10.2  MG 2.1  --   --   --   < > = values in this interval not displayed.    IMAGING No results found.    PHYSICAL EXAM:  Stable today, he is more alert General - Well nourished, well developed, in no apparent distress.  Ophthalmologic - Fundi not visualized due to noncooperation.  Cardiovascular - Regular rate and rhythm.  Neuro - alert, global aphasia, minimal speech output limited to self only, did not follow commands. Right neglect, left gaze preference, but able to cross midline. Blinking to visual threat on the right, but not to the left. PERRL. Right facial droop, tongue in middle in mouth. Right hemiplegia, trace withdraw on right UE and 3-/5 RLE with pain stimulation. LUE and LLE spontaneous movement. Sensation, coordination and gait not tested.    ASSESSMENT/PLAN [name] is a 50 y.o. male with history of HTN found down in the bathroom. CT showed large left thalamic ICH.    Stroke:  Dominant left thalamic hemorrhage w/ IVH and cerebral edema, secondary to hypertensive emergency  CT L thalamic hmg with IVH  Repeat CT evolving L thalamic hmg w/ IVH with hydrocephalus, mild  CTA head L thalamic hmg 9.417ml, localized vasogenic edema and mass effect, 5mm L to R shift, IVH. No high grade stenosis  Repeat CT head remains stable  Carotid Doppler  No significant stenosis   2D Echo  EF 60-65%  LDL 157  HgbA1c 5.8  SCDs and subq heparin for VTE prophylaxis  Amantadine given for 7 days to improve arousal. (D#6/7) DIET - DYS 1 Room service appropriate?: Yes; Fluid consistency:: Nectar Thick  No antithrombotic prior to admission  Ongoing aggressive stroke risk factor management  Therapy recommendations:  CIR vs SNF. Social worker following  Disposition:  Pending. Medically ready for discharge when bed available.   Fever  Tmax 99 today - improving  Could be due to aspiration   UA was negative.  Blood cultures negative. CXR negative.   No Abx tx  Transient V-tach  Likely associated with ICH  EF 60-65%  Cardiology consulted  continue metoprolol  Hypertensive Emergency  BP as high as 215/120, 213/130 on arrival in setting of neuro changes  Initially Treated with cleviprex drip  SBP goal < 160  On metoprolol to 100mg  bid, continue amlodipine 10mg  Qd and lisinopril 20mg  bid  Increased hydralazine to 100 mg Q8  Secondary HTN work up so far negative.   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 Hart RochesterLawson, recommended a follow up as an outpatient in 4 weeks with a CT angiogram of abdomin and pelvis and to discuss further treatment.   Per EPIC, there has been no follow up  Will order CTA abd/pelvis once Cre normalized and stabilized - 1.66  Other Active Problems  Hypokalemia, resolved 4.1  Hospital day # 9 I have personally examined this patient, reviewed notes, independently viewed imaging studies, participated in medical decision making and plan of care. I have made any additions or clarifications directly to the above note.    Patient is medically stable to be discharged to skilled nursing facility later today if bed available  Delia HeadyPramod Chelcie Estorga, MD Medical Director [facility] Pager: [phone] 08/29/2015 2:16 PM     To contact Stroke Continuity provider, please refer to [email or web address]. After hours, contact General Neurology

## 2015-08-30 DIAGNOSIS — R509 Fever, unspecified: Secondary | ICD-10-CM | POA: Diagnosis not present

## 2015-08-30 DIAGNOSIS — I610 Nontraumatic intracerebral hemorrhage in hemisphere, subcortical: Secondary | ICD-10-CM | POA: Diagnosis not present

## 2015-08-30 LAB — URINALYSIS, ROUTINE W REFLEX MICROSCOPIC
BILIRUBIN URINE: NEGATIVE
GLUCOSE, UA: NEGATIVE mg/dL
HGB URINE DIPSTICK: NEGATIVE
KETONES UR: NEGATIVE mg/dL
Leukocytes, UA: NEGATIVE
Nitrite: NEGATIVE
PROTEIN: 100 mg/dL — AB
Specific Gravity, Urine: 1.02 (ref 1.005–1.030)
pH: 5.5 (ref 5.0–8.0)

## 2015-08-30 LAB — BASIC METABOLIC PANEL
ANION GAP: 6 (ref 5–15)
BUN: 42 mg/dL — ABNORMAL HIGH (ref 6–20)
CALCIUM: 9.9 mg/dL (ref 8.9–10.3)
CO2: 26 mmol/L (ref 22–32)
Chloride: 120 mmol/L — ABNORMAL HIGH (ref 101–111)
Creatinine, Ser: 1.77 mg/dL — ABNORMAL HIGH (ref 0.61–1.24)
GFR, EST AFRICAN AMERICAN: 50 mL/min — AB (ref >60)
GFR, EST NON AFRICAN AMERICAN: 43 mL/min — AB (ref >60)
Glucose, Bld: 115 mg/dL — ABNORMAL HIGH (ref 65–99)
Potassium: 4.2 mmol/L (ref 3.5–5.1)
Sodium: 152 mmol/L — ABNORMAL HIGH (ref 135–145)

## 2015-08-30 LAB — GLUCOSE, CAPILLARY
GLUCOSE-CAPILLARY: 123 mg/dL — AB (ref 65–99)
GLUCOSE-CAPILLARY: 115 mg/dL — AB (ref 65–99)
Glucose-Capillary: 88 mg/dL (ref 65–99)

## 2015-08-30 LAB — URINE MICROSCOPIC-ADD ON

## 2015-08-30 MED ORDER — SODIUM CHLORIDE 0.45 % IV SOLN
INTRAVENOUS | Status: DC
  Administered 2015-08-30 – 2015-08-31 (×2): via INTRAVENOUS

## 2015-08-30 NOTE — Clinical Social Work Note (Signed)
[facility] canceled yesterday, 6/28 due to patient having fever.   Patient approved for 90 days at a contracted [facility] SN[facility].   1. [facility]/Admissions Director: Rosalie Gumsiffany Malay [phone] (left message for returned phone call) 2. [facility]/Admissions Director: Ulyses Southwarduth King [phone] (NO VA BEDS AVAILABLE) 3. [facility]/Admissions Director: Burke KeelsKatie Mabe [phone] (currently does NOT have male beds)  [facility] under LOG if [facility] contracted facility is not available at the time of d/c. Patient mother informed and aware that patient did not transfer to SNF yesterday due to fever.   Patient remains with temp of 100.5 today and will likely been ready for [facility] 6[facility].   CSW remains available as needed.   Derenda FennelBashira Galan Ghee, MSW, LCSWA [phone] 08/30/2015 3:28 PM

## 2015-08-30 NOTE — Progress Notes (Signed)
SLP Cancellation Note  Patient Details Name: Andre [name] MRN: [id number] DOB: Jul 01, 1965   Cancelled treatment:       Reason Eval/Treat Not Completed: Fatigue/lethargy limiting ability to participate. Pt minimally alert this a.m; has not yet had breakfast. Was not responding to verbal prompts and not following commands; opened eyes briefly but then repeatedly fell back asleep. Will reattempt as time allows.   [place], Aniesha Haughn K, MA, CCC-SLP 08/30/2015, 9:07 AM [phone]

## 2015-08-30 NOTE — Progress Notes (Signed)
Sent for studies includingTROKE TEAM PROGRESS NOTE   SUBJECTIVE (INTERVAL HISTORY) Patient for discharge yesterday. Discharge canceled due to Temp 101.7. WBC normal. BC without growth. Received 1 dose of zosyn. CXR ok. TM 100.5 thus far today. Patient without physical changes. Tolerating diet - currently being fed by NT at bedside.    OBJECTIVE Temp:  [99 F (37.2 C)-101.7 F (38.7 C)] 99 F (37.2 C) (06/29 0912) Pulse Rate:  [94-114] 104 (06/29 0912) Cardiac Rhythm:  [-] Sinus tachycardia (06/29 0700) Resp:  [20-24] 20 (06/29 0912) BP: (149-171)/(95-101) 156/97 mmHg (06/29 0912) SpO2:  [97 %-99 %] 99 % (06/29 0912) Weight:  [63.73 kg (140 lb 8 oz)] 63.73 kg (140 lb 8 oz) (06/29 0535)  CBC:   Recent Labs Lab 08/25/15 0544 08/29/15 1950  WBC 9.0 9.7  HGB 14.1 13.9  HCT 41.5 43.4  MCV 86.5 93.3  PLT 291 335    Basic Metabolic Panel:   Recent Labs Lab 08/29/15 1950 08/30/15 0239  NA 150* 152*  K 4.1 4.2  CL 117* 120*  CO2 26 26  GLUCOSE 118* 115*  BUN 44* 42*  CREATININE 1.77* 1.77*  CALCIUM 9.7 9.9      IMAGING Dg Chest Port 1 View  08/29/2015  CLINICAL DATA:  50 year old male with tachypnea EXAM: PORTABLE CHEST 1 VIEW COMPARISON:  Chest radiograph dated 08/24/2015 FINDINGS: The heart size and mediastinal contours are within normal limits. Both lungs are clear. The visualized skeletal structures are unremarkable. IMPRESSION: No active disease. Electronically Signed   By: Elgie CollardArash  Radparvar M.D.   On: 08/29/2015 19:54      PHYSICAL EXAM:  Stable today, he is more alert General - Well nourished, well developed, in no apparent distress.  Ophthalmologic - Fundi not visualized due to noncooperation.  Cardiovascular - Regular rate and rhythm.  Neuro - alert, global aphasia, minimal speech output limited to self only, did not follow commands. Right neglect, left gaze preference, but able to cross midline. Blinking to visual threat on the right, but not to the left.  PERRL. Right facial droop, tongue in middle in mouth. Right hemiplegia, trace withdraw on right UE and 3-/5 RLE with pain stimulation. LUE and LLE spontaneous movement. Sensation, coordination and gait not tested.    ASSESSMENT/PLAN Mr. Zenovia Jarredheodore W Swatek is a 50 y.o. male with history of HTN found down in the bathroom. CT showed large left thalamic ICH.   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 No high grade stenosis  Repeat CT head remains stable  Carotid Doppler  No significant stenosis   2D Echo  EF 60-65%  LDL 157  HgbA1c 5.8  SCDs and subq heparin for VTE prophylaxis  Amantadine given for 7 days to improve arousal. (D#7/7) will stop after full 7 days completed   No antithrombotic prior to admission  Ongoing aggressive stroke risk factor management  Therapy recommendations:  SNF. Social worker following  Disposition:  Pending. Medically ready for discharge when bed available.   Discharge delayed d/t fever yesterday  Fever  Tmax 101.7 last night. 100.5 today  Could be due to aspiration   WBC normal  UA was previously negative.  Will check again  Blood cultures previously negative,  no growth on repeated ones yest  CXRs negative.   Received 1 dose of zosyn.   Check labs in am  Transient V-tach  Likely associated with ICH  EF 60-65%  Cardiology consulted  continue metoprolol  Hypertensive Emergency  BP as high as 215/120, 213/130 on arrival in setting of neuro changes  Initially Treated with cleviprex drip  SBP goal < 160  On metoprolol to 100mg  bid, continue amlodipine 10mg  Qd and lisinopril 20mg  bid  Increased hydralazine to 100 mg Q8  Secondary HTN work up  so far negative.   Hyperlipidemia  Home meds:  lipitor 20  LDL 157, goal < 70  Resume lipitor 20  Continue statin on discharge  Hyponatremia, Hypernatremia   Mild 133->134->135->147->150->152  On salt tablet 2g tid which were discontinued 6/26. 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 Hart RochesterLawson, recommended a follow up as an outpatient in 4 weeks with a CT angiogram of abdomin and pelvis and to discuss further treatment.   Per EPIC, there has been no follow up  Consider CTA abd/pelvis once Cre normalized and stabilized - 1.77, likely as an OP  Other Active Problems  Hypokalemia, resolved 4.1  Hospital day # 10  Rhoderick MoodyBIBY,SHARON  [facility] See Amion for Pager information 08/30/2015 1:57 PM  I have personally examined this patient, reviewed notes, independently viewed imaging studies, participated in medical decision making and plan of care. I have made any additions or clarifications directly to the above note. Agree with note above.  He was not discharged to skilled nursing facility yesterday as he spiked a fever but he has remained afebrile since then. Blood cultures have been negative and chest x-ray shows no pneumonia. Recommend observation for 24 hours and repeat CBC and check UA. May consider transferring to skilled nursing facility tomorrow if stable.  Delia HeadyPramod Sethi, MD Medical Director [facility] Pager: [phone] 08/30/2015 3:17 PM    To contact Stroke Continuity provider, please refer to [email or web address]. After hours, contact General Neurology

## 2015-08-30 NOTE — Progress Notes (Signed)
Physical Therapy Treatment Patient Details Name: Andre [name] MRN: [id number] DOB: [birth year] Today's Date: 08/30/2015    History of Present Illness Pt admitted on 08/20/15 s/p large basal ganglia hemorrhage with extensive IVH    PT Comments    Progressing slowly.  Emphasis on sitting balance and drawing attention to the right.  Follow Up Recommendations  SNF     Equipment Recommendations  Other (comment)    Recommendations for Other Services       Precautions / Restrictions Precautions Precautions: Fall Precaution Comments: R neglect    Mobility  Bed Mobility Overal bed mobility: Needs Assistance Bed Mobility: Rolling;Sidelying to Sit;Sit to Sidelying Rolling: Max assist Sidelying to sit: Max assist;Total assist     Sit to sidelying: Total assist General bed mobility comments: cues for initiation.  assist to come forward and scoot.  Transfers                    Ambulation/Gait                 Stairs            Wheelchair Mobility    Modified Rankin (Stroke Patients Only) Modified Rankin (Stroke Patients Only) Pre-Morbid Rankin Score: No symptoms Modified Rankin: Severe disability     Balance Overall balance assessment: Needs assistance Sitting-balance support: Feet supported;Single extremity supported Sitting balance-Leahy Scale: Poor Sitting balance - Comments: sat EOB x 7 min working on                             [employer] Arousal/Alertness: Awake/alert Behavior During Therapy: Flat affect Overall Cognitive Status: Impaired/Different from baseline Area of Impairment: Orientation;Attention;Memory;Following commands;Safety/judgement;Awareness;Problem solving Orientation Level: Disoriented to;Place;Time Current Attention Level: Sustained Memory: Decreased short-term memory Following Commands: Follows one step commands inconsistently Safety/Judgement: Decreased awareness of safety;Decreased awareness of  deficits Awareness: Intellectual Problem Solving: Slow processing;Decreased initiation;Difficulty sequencing;Requires verbal cues;Requires tactile cues General Comments: Increased scanning into R field with repetitave vc and tactile cues. Pt places so TV in R visual field, requiring pt to look R to see TV. Poor  problem solving    Exercises      General Comments General comments (skin integrity, edema, etc.): worked on attending to R side.      Pertinent Vitals/Pain Pain Assessment: Faces Faces Pain Scale: No hurt    Home Living                      Prior Function            PT Goals (current goals can now be found in the care plan section) Acute Rehab PT Goals Patient Stated Goal: none stated PT Goal Formulation: Patient unable to participate in goal setting Time For Goal Achievement: 09/04/15 Potential to Achieve Goals: Fair Progress towards PT goals: Progressing toward goals    Frequency  Min 4X/week    PT Plan Current plan remains appropriate    Co-evaluation             End of Session   Activity Tolerance: Patient tolerated treatment well Patient left: in bed;with call bell/phone within reach;with nursing/sitter in room     Time: 1710-1732 PT Time Calculation (min) (ACUTE ONLY): 22 min  Charges:  $Therapeutic Activity: 8-22 mins                    G Codes:      Gerrad Welker, Iantha FallenKenneth  V 08/30/2015, 5:40 PM 08/30/2015  Derby BingKen Laquiesha Piacente, PT [phone]  (pager)

## 2015-08-31 DIAGNOSIS — I610 Nontraumatic intracerebral hemorrhage in hemisphere, subcortical: Secondary | ICD-10-CM | POA: Diagnosis not present

## 2015-08-31 LAB — CBC
HCT: 43.8 % (ref 39.0–52.0)
Hemoglobin: 13.7 g/dL (ref 13.0–17.0)
MCH: 29.3 pg (ref 26.0–34.0)
MCHC: 31.3 g/dL (ref 30.0–36.0)
MCV: 93.8 fL (ref 78.0–100.0)
Platelets: 310 10*3/uL (ref 150–400)
RBC: 4.67 MIL/uL (ref 4.22–5.81)
RDW: 13.6 % (ref 11.5–15.5)
WBC: 8 10*3/uL (ref 4.0–10.5)

## 2015-08-31 LAB — BASIC METABOLIC PANEL
ANION GAP: 9 (ref 5–15)
BUN: 45 mg/dL — ABNORMAL HIGH (ref 6–20)
CALCIUM: 10.2 mg/dL (ref 8.9–10.3)
CO2: 24 mmol/L (ref 22–32)
Chloride: 118 mmol/L — ABNORMAL HIGH (ref 101–111)
Creatinine, Ser: 1.85 mg/dL — ABNORMAL HIGH (ref 0.61–1.24)
GFR calc Af Amer: 48 mL/min — ABNORMAL LOW (ref >60)
GFR calc non Af Amer: 41 mL/min — ABNORMAL LOW (ref >60)
GLUCOSE: 110 mg/dL — AB (ref 65–99)
Potassium: 3.9 mmol/L (ref 3.5–5.1)
Sodium: 151 mmol/L — ABNORMAL HIGH (ref 135–145)

## 2015-08-31 LAB — GLUCOSE, CAPILLARY
Glucose-Capillary: 106 mg/dL — ABNORMAL HIGH (ref 65–99)
Glucose-Capillary: 152 mg/dL — ABNORMAL HIGH (ref 65–99)
Glucose-Capillary: 93 mg/dL (ref 65–99)

## 2015-08-31 NOTE — Progress Notes (Signed)
[facility]

## 2015-08-31 NOTE — Progress Notes (Signed)
Speech Language Pathology Treatment: Dysphagia  Patient Details Name: Zenovia Jarredheodore W Buckels MRN: [id number] DOB: February 02, 1966 Today's Date: 08/31/2015 Time: [id number] SLP Time Calculation (min) (ACUTE ONLY): 34 min  Assessment / Plan / Recommendation Clinical Impression  Pt with NT in room providing him breakfast.  No indication of airway compromise with po intake but mild delay in swallow continues.    NT feeding pt standing on left side and was very attentive to pt.    Pt observed with advancement in diet to solids/soft peaches, nectar via straw and ice chip boluses.  SLP encouraged pt to self feed using left hand - to maximize airway protection.   Pt able to masticate peaches well without oral residuals nor with cues required to clear.   Consumption of liquids via cup/straw observed with good tolerance although pt is impulsive and takes large sequential boluses.  Advised him to need to take small sips and silent laryngeal penetration of thin liquid on prior MBS 6/23.   Ice consumed by pt with adequate mastication/tolerance and anticipate ice chips would improve pt's QOL with safety.    Recommend advance diet to dys3/puree meats/nectar and allow ice chips.  Will follow up for dietary tolerance of advancement.  Updated NT, RN and pt and all agree with plan.    HPI HPI: Zenovia Jarredheodore W Gayman is a 50 y.o. male with a history of HTN who was found in the bathroom and and had vomitted. CT showed a large BG hemorrhage with extensive IVH. PMH: tobacco use, ETOH abuse, ICH posterior pons and midbrain.  Pt underwent MBS on 08/24/15 and was placed on dys1/nectar diet.  He has spiked some tempertures over the last few days, CXR negative.  Pt seen for follow up SLP dysphagia tx.        SLP Plan  Continue with current plan of care     Recommendations  Diet recommendations: Dysphagia 3 (mechanical soft);Nectar-thick liquid;Other(comment) (ice chips) Liquids provided via: Cup;Straw Medication Administration:  Crushed with puree Supervision: Full supervision/cueing for compensatory strategies Compensations: Slow rate;Small sips/bites;Lingual sweep for clearance of pocketing Postural Changes and/or Swallow Maneuvers: Seated upright 90 degrees;Upright 30-60 min after meal             Follow up Recommendations: Skilled Nursing facility Plan: Continue with current plan of care     GO                Mills KollerKimball, Irasema Chalk Ann Marielle Mantione, MS [facility] SLP [phone]

## 2015-08-31 NOTE — Progress Notes (Signed)
Attempted report x1 with [facility]

## 2015-08-31 NOTE — Progress Notes (Signed)
Sent for studies includingTROKE TEAM PROGRESS NOTE   SUBJECTIVE (INTERVAL HISTORY) Patient for discharge today. Now afebrile  36 hrs. WBC normal today. Patient without physical changes. Tolerating diet - currently being fed by NT at bedside.    OBJECTIVE Temp:  [98.6 F (37 C)-100.9 F (38.3 C)] 100.9 F (38.3 C) (06/30 1417) Pulse Rate:  [81-107] 81 (06/30 1439) Cardiac Rhythm:  [-] Normal sinus rhythm (06/30 0700) Resp:  [16-25] 25 (06/30 1439) BP: (139-162)/(92-110) 148/92 mmHg (06/30 1439) SpO2:  [94 %-98 %] 97 % (06/30 1439)  CBC:   Recent Labs Lab 08/29/15 1950 08/31/15 0641  WBC 9.7 8.0  HGB 13.9 13.7  HCT 43.4 43.8  MCV 93.3 93.8  PLT 335 310    Basic Metabolic Panel:   Recent Labs Lab 08/30/15 0239 08/31/15 0641  NA 152* 151*  K 4.2 3.9  CL 120* 118*  CO2 26 24  GLUCOSE 115* 110*  BUN 42* 45*  CREATININE 1.77* 1.85*  CALCIUM 9.9 10.2      IMAGING No results found.    PHYSICAL EXAM:  Stable today, he is more alert General - Well nourished, well developed, in no apparent distress.  Ophthalmologic - Fundi not visualized due to noncooperation.  Cardiovascular - Regular rate and rhythm.  Neuro - alert, global aphasia, minimal speech output limited to self only, did not follow commands. Right neglect, left gaze preference, but able to cross midline. Blinking to visual threat on the right, but not to the left. PERRL. Right facial droop, tongue in middle in mouth. Right hemiplegia, trace withdraw on right UE and 3-/5 RLE with pain stimulation. LUE and LLE spontaneous movement. Sensation, coordination and gait not tested.    ASSESSMENT/PLAN Andre [name] is a 50 y.o. male with history of HTN found down in the bathroom. CT showed large left thalamic ICH.   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 No high grade stenosis  Repeat CT head remains stable  Carotid Doppler  No significant stenosis   2D Echo  EF 60-65%  LDL 157  HgbA1c 5.8  SCDs and subq heparin for VTE prophylaxis  Amantadine given for 7 days to improve arousal. (D#7/7) will stop after full 7 days completed   No antithrombotic prior to admission  Ongoing aggressive stroke risk factor management  Therapy recommendations:  SNF. Social worker following  Disposition:  Pending. Medically ready for discharge when bed available.   Discharge delayed d/t fever yesterday  Fever  Tmax 101.7 last night. 100.5 today  Could be due to aspiration   WBC normal  UA was previously negative.  Will check again  Blood cultures previously negative, no growth on repeated ones yest  CXRs negative.   Received 1 dose of zosyn.   Check labs in am  Transient V-tach  Likely associated with ICH  EF 60-65%  Cardiology consulted  continue metoprolol  Hypertensive Emergency  BP as high as 215/120, 213/130 on arrival in setting of neuro changes  Initially Treated with cleviprex drip  SBP goal < 160  On metoprolol to 100mg  bid, continue amlodipine 10mg  Qd and lisinopril 20mg  bid  Increased hydralazine to 100 mg Q8  Secondary HTN work up so  far negative.   Hyperlipidemia  Home meds:  lipitor 20  LDL 157, goal < 70  Resume lipitor 20  Continue statin on discharge  Hyponatremia, Hypernatremia   Mild 133->134->135->147->150->152  On salt tablet 2g tid which were discontinued 6/26. 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 Hart RochesterLawson, recommended a follow up as an outpatient in 4 weeks with a CT angiogram of abdomin and pelvis and to discuss further treatment.   Per EPIC, there has been no follow up  Consider CTA abd/pelvis once Cre normalized and stabilized - 1.77, likely as an OP  Other Active Problems  Hypokalemia, resolved 4.1  Hospital day # 11    I have personally examined this patient, reviewed notes, independently viewed imaging studies, participated in medical decision making and plan of care. I have made any additions or clarifications directly to the above note. Marland Kitchen.  He was not discharged to skilled nursing facility 08/29/15 as he spiked a fever but he has remained afebrile since then. Blood cultures have been negative and chest x-ray shows no pneumonia.  and repeat CBC and   UA were unremarkable.. Plan transferring to skilled nursing facility today if bed available.  Delia HeadyPramod Sethi, MD Medical Director [facility] Pager: [phone] 08/31/2015 4:48 PM    To contact Stroke Continuity provider, please refer to [email or web address]. After hours, contact General Neurology

## 2015-08-31 NOTE — Progress Notes (Signed)
Speech Language Pathology Treatment: Cognitive-Linquistic  Patient Details Name: Andre [name] MRN: [id number] DOB: 1965/10/29 Today's Date: 08/31/2015 Time: 4098-11910926-0941 SLP Time Calculation (min) (ACUTE ONLY): 15 min  Assessment / Plan / Recommendation Clinical Impression  Treatment focused on maximizing pt's cognitive linguistic skills to aid in his rehabilitation.  Pt tends to speak at low phonatory strength with rapid rate and imprecise articulation.  Pt benefited from total cues to slow rate and over-articulate!!!   Difficult to determine level of aphasia vs dysarthria due.  He continues with right inattention requiring total cues to attend to right with success at approximately 65% of opportunities - use of following hand to right to locate items on table successful 65% of time.  Pt able to repeat meaningful sentences with clarity *eg "You've been through a lot".  Paraphasia noted x1 to spoon but upon pt viewing object - visual cue - successful.  Pt is making slow progress and will benefit from continued aggressive SLP treatment.         HPI HPI: Andre [name] is a 50 y.o. male with a history of HTN who was found in the bathroom and and had vomitted. CT showed a large BG hemorrhage with extensive IVH. PMH: tobacco use, ETOH abuse, ICH posterior pons and midrain- no documentation of swallow assessment.       SLP Plan  Continue with current plan of care     Recommendations  Diet recommendations: Dysphagia 3 (mechanical soft);Nectar-thick liquid;Other(comment) (ice chips) Liquids provided via: Cup;Straw Medication Administration: Crushed with puree Supervision: Full supervision/cueing for compensatory strategies Compensations: Slow rate;Small sips/bites;Lingual sweep for clearance of pocketing Postural Changes and/or Swallow Maneuvers: Seated upright 90 degrees;Upright 30-60 min after meal             Follow up Recommendations: Skilled Nursing facility Plan: Continue with  current plan of care     GO                Andre [name], Andre Segoviano Ann Captola Teschner, MS [facility] SLP [phone]

## 2015-08-31 NOTE — Clinical Social Work Note (Signed)
Clinical Social Worker facilitated patient discharge including contacting patient family and facility to confirm patient discharge plans.  Clinical information faxed to facility and family agreeable with plan.  CSW [facility]  RN to call report prior to discharge.  Clinical Social Worker will sign off for now as social work intervention is no longer needed. Please consult us again if new need arises.  Derenda FennelBashira Jonan Seufert, MSW, LCSWA [phone] 08/31/2015 3:20 PM

## 2015-08-31 NOTE — Clinical Social Work Placement (Signed)
   CLINICAL SOCIAL WORK PLACEMENT  NOTE  Date:  08/31/2015  Patient Details  Name: Andre [name] MRN: [id number] Date of Birth: [birth year]  Clinical Social Work is seeking post-discharge placement for this patient at the Skilled  Nursing Facility level of care (*CSW will initial, date and re-position this form in  chart as items are completed):  No   Patient/family provided with [facility] Clinical Social Work Department's list of facilities offering this level of care within the geographic area requested by the patient (or if unable, by the patient's family).  Yes   Patient/family informed of their freedom to choose among providers that offer the needed level of care, that participate in Medicare, Medicaid or managed care program needed by the patient, have an available bed and are willing to accept the patient.  No   Patient/family informed of [facility]'s ownership interest in [facility] and [facility], as well as of the fact that they are under no obligation to receive care at these facilities.  PASRR submitted to EDS on 08/27/15     PASRR number received on 08/27/15     Existing PASRR number confirmed on       FL2 transmitted to all facilities in geographic area requested by pt/family on       FL2 transmitted to all facilities within larger geographic area on       Patient informed that his/her managed care company has contracts with or will negotiate with certain facilities, including the following:        Yes   Patient/family informed of bed offers received.  Patient chooses bed at  [facility] )     Physician recommends and patient chooses bed at      Patient to be transferred to  [facility] ) on 08/31/15.  Patient to be transferred to facility by  [facility])     Patient family notified on 08/31/15 of transfer.  Name of family member notified:   (Pt's mother, Lucio EdwardSallie )     PHYSICIAN Please sign FL2, Please prepare  priority discharge summary, including medications     Additional Comment:    _______________________________________________ Vaughan BrownerNixon, Joie Hipps A, LCSW 08/31/2015, 3:19 PM

## 2015-08-31 NOTE — Progress Notes (Signed)
MD notified that patient temp is 100.9 orally, less alert, difficult to arouse, respirations 24. Per MD, continue with discharge.IV discontinued.

## 2015-09-03 LAB — CULTURE, BLOOD (ROUTINE X 2)
CULTURE: NO GROWTH
CULTURE: NO GROWTH

## 2015-09-08 ENCOUNTER — Emergency Department (HOSPITAL_COMMUNITY): Payer: Non-veteran care

## 2015-09-08 ENCOUNTER — Inpatient Hospital Stay (HOSPITAL_COMMUNITY): Payer: Non-veteran care

## 2015-09-08 ENCOUNTER — Inpatient Hospital Stay (HOSPITAL_COMMUNITY)
Admission: EM | Admit: 2015-09-08 | Discharge: 2015-10-02 | DRG: 682 | Disposition: E | Payer: Non-veteran care | Attending: Internal Medicine | Admitting: Internal Medicine

## 2015-09-08 ENCOUNTER — Encounter (HOSPITAL_COMMUNITY): Payer: Self-pay

## 2015-09-08 DIAGNOSIS — Z681 Body mass index (BMI) 19 or less, adult: Secondary | ICD-10-CM

## 2015-09-08 DIAGNOSIS — I69122 Dysarthria following nontraumatic intracerebral hemorrhage: Secondary | ICD-10-CM | POA: Diagnosis not present

## 2015-09-08 DIAGNOSIS — R1312 Dysphagia, oropharyngeal phase: Secondary | ICD-10-CM | POA: Diagnosis present

## 2015-09-08 DIAGNOSIS — Z515 Encounter for palliative care: Secondary | ICD-10-CM | POA: Diagnosis present

## 2015-09-08 DIAGNOSIS — E860 Dehydration: Secondary | ICD-10-CM | POA: Diagnosis present

## 2015-09-08 DIAGNOSIS — R739 Hyperglycemia, unspecified: Secondary | ICD-10-CM | POA: Diagnosis present

## 2015-09-08 DIAGNOSIS — E872 Acidosis: Secondary | ICD-10-CM | POA: Diagnosis present

## 2015-09-08 DIAGNOSIS — Z4659 Encounter for fitting and adjustment of other gastrointestinal appliance and device: Secondary | ICD-10-CM

## 2015-09-08 DIAGNOSIS — Z66 Do not resuscitate: Secondary | ICD-10-CM | POA: Diagnosis present

## 2015-09-08 DIAGNOSIS — E878 Other disorders of electrolyte and fluid balance, not elsewhere classified: Secondary | ICD-10-CM

## 2015-09-08 DIAGNOSIS — I69120 Aphasia following nontraumatic intracerebral hemorrhage: Secondary | ICD-10-CM

## 2015-09-08 DIAGNOSIS — F17210 Nicotine dependence, cigarettes, uncomplicated: Secondary | ICD-10-CM | POA: Diagnosis present

## 2015-09-08 DIAGNOSIS — G92 Toxic encephalopathy: Secondary | ICD-10-CM | POA: Diagnosis present

## 2015-09-08 DIAGNOSIS — I69191 Dysphagia following nontraumatic intracerebral hemorrhage: Secondary | ICD-10-CM | POA: Diagnosis not present

## 2015-09-08 DIAGNOSIS — N189 Chronic kidney disease, unspecified: Secondary | ICD-10-CM | POA: Diagnosis present

## 2015-09-08 DIAGNOSIS — E870 Hyperosmolality and hypernatremia: Secondary | ICD-10-CM | POA: Diagnosis present

## 2015-09-08 DIAGNOSIS — N19 Unspecified kidney failure: Secondary | ICD-10-CM

## 2015-09-08 DIAGNOSIS — I69151 Hemiplegia and hemiparesis following nontraumatic intracerebral hemorrhage affecting right dominant side: Secondary | ICD-10-CM

## 2015-09-08 DIAGNOSIS — L8992 Pressure ulcer of unspecified site, stage 2: Secondary | ICD-10-CM | POA: Diagnosis present

## 2015-09-08 DIAGNOSIS — E861 Hypovolemia: Secondary | ICD-10-CM | POA: Diagnosis present

## 2015-09-08 DIAGNOSIS — I129 Hypertensive chronic kidney disease with stage 1 through stage 4 chronic kidney disease, or unspecified chronic kidney disease: Secondary | ICD-10-CM | POA: Diagnosis present

## 2015-09-08 DIAGNOSIS — N179 Acute kidney failure, unspecified: Secondary | ICD-10-CM | POA: Diagnosis present

## 2015-09-08 DIAGNOSIS — R0600 Dyspnea, unspecified: Secondary | ICD-10-CM | POA: Diagnosis not present

## 2015-09-08 DIAGNOSIS — E46 Unspecified protein-calorie malnutrition: Secondary | ICD-10-CM | POA: Diagnosis present

## 2015-09-08 DIAGNOSIS — L8990 Pressure ulcer of unspecified site, unspecified stage: Secondary | ICD-10-CM | POA: Diagnosis not present

## 2015-09-08 DIAGNOSIS — E875 Hyperkalemia: Secondary | ICD-10-CM | POA: Diagnosis present

## 2015-09-08 DIAGNOSIS — D696 Thrombocytopenia, unspecified: Secondary | ICD-10-CM | POA: Diagnosis present

## 2015-09-08 DIAGNOSIS — I612 Nontraumatic intracerebral hemorrhage in hemisphere, unspecified: Secondary | ICD-10-CM

## 2015-09-08 DIAGNOSIS — G9340 Encephalopathy, unspecified: Secondary | ICD-10-CM

## 2015-09-08 HISTORY — DX: Tachycardia, unspecified: R00.0

## 2015-09-08 HISTORY — DX: Dysphagia, unspecified: R13.10

## 2015-09-08 HISTORY — DX: Nontraumatic intracerebral hemorrhage in hemisphere, subcortical: I61.0

## 2015-09-08 HISTORY — DX: Tachypnea, not elsewhere classified: R06.82

## 2015-09-08 HISTORY — DX: Abdominal aortic aneurysm, without rupture, unspecified: I71.40

## 2015-09-08 HISTORY — DX: Dysarthria and anarthria: R47.1

## 2015-09-08 HISTORY — DX: Abdominal aortic aneurysm, without rupture: I71.4

## 2015-09-08 LAB — CBC WITH DIFFERENTIAL/PLATELET
BASOS ABS: 0 10*3/uL (ref 0.0–0.1)
Basophils Relative: 0 %
EOS ABS: 0.1 10*3/uL (ref 0.0–0.7)
Eosinophils Relative: 1 %
HCT: 44.5 % (ref 39.0–52.0)
HEMOGLOBIN: 14 g/dL (ref 13.0–17.0)
LYMPHS PCT: 14 %
Lymphs Abs: 1 10*3/uL (ref 0.7–4.0)
MCH: 29.4 pg (ref 26.0–34.0)
MCHC: 31.5 g/dL (ref 30.0–36.0)
MCV: 93.3 fL (ref 78.0–100.0)
MONO ABS: 0.4 10*3/uL (ref 0.1–1.0)
Monocytes Relative: 5 %
NEUTROS ABS: 5.9 10*3/uL (ref 1.7–7.7)
NEUTROS PCT: 80 %
PLATELETS: 136 10*3/uL — AB (ref 150–400)
RBC: 4.77 MIL/uL (ref 4.22–5.81)
RDW: 14.5 % (ref 11.5–15.5)
WBC: 7.4 10*3/uL (ref 4.0–10.5)

## 2015-09-08 LAB — URINALYSIS W MICROSCOPIC (NOT AT ARMC)
Glucose, UA: NEGATIVE mg/dL
Ketones, ur: 15 mg/dL — AB
LEUKOCYTES UA: NEGATIVE
Nitrite: NEGATIVE
PH: 5 (ref 5.0–8.0)
Protein, ur: 30 mg/dL — AB
SPECIFIC GRAVITY, URINE: 1.026 (ref 1.005–1.030)

## 2015-09-08 LAB — CBC
HEMATOCRIT: 46.1 % (ref 39.0–52.0)
HEMOGLOBIN: 14.3 g/dL (ref 13.0–17.0)
MCH: 29.6 pg (ref 26.0–34.0)
MCHC: 31 g/dL (ref 30.0–36.0)
MCV: 95.4 fL (ref 78.0–100.0)
Platelets: 121 10*3/uL — ABNORMAL LOW (ref 150–400)
RBC: 4.83 MIL/uL (ref 4.22–5.81)
RDW: 14.9 % (ref 11.5–15.5)
WBC: 6.9 10*3/uL (ref 4.0–10.5)

## 2015-09-08 LAB — COMPREHENSIVE METABOLIC PANEL
ALBUMIN: 2.6 g/dL — AB (ref 3.5–5.0)
ALK PHOS: 76 U/L (ref 38–126)
ALT: 154 U/L — ABNORMAL HIGH (ref 17–63)
ANION GAP: 22 — AB (ref 5–15)
AST: 85 U/L — ABNORMAL HIGH (ref 15–41)
BILIRUBIN TOTAL: 0.6 mg/dL (ref 0.3–1.2)
BUN: 275 mg/dL — ABNORMAL HIGH (ref 6–20)
CALCIUM: 9.9 mg/dL (ref 8.9–10.3)
CO2: 15 mmol/L — ABNORMAL LOW (ref 22–32)
Chloride: 129 mmol/L — ABNORMAL HIGH (ref 101–111)
Creatinine, Ser: 15.17 mg/dL — ABNORMAL HIGH (ref 0.61–1.24)
GFR, EST AFRICAN AMERICAN: 4 mL/min — AB (ref >60)
GFR, EST NON AFRICAN AMERICAN: 3 mL/min — AB (ref >60)
GLUCOSE: 128 mg/dL — AB (ref 65–99)
SODIUM: 166 mmol/L — AB (ref 135–145)
TOTAL PROTEIN: 6.4 g/dL — AB (ref 6.5–8.1)

## 2015-09-08 LAB — GLUCOSE, CAPILLARY
GLUCOSE-CAPILLARY: 91 mg/dL (ref 65–99)
GLUCOSE-CAPILLARY: 130 mg/dL — AB (ref 65–99)

## 2015-09-08 LAB — MAGNESIUM: MAGNESIUM: 3.7 mg/dL — AB (ref 1.7–2.4)

## 2015-09-08 LAB — BASIC METABOLIC PANEL
BUN: 273 mg/dL — ABNORMAL HIGH (ref 6–20)
CALCIUM: 10.5 mg/dL — AB (ref 8.9–10.3)
CO2: 13 mmol/L — ABNORMAL LOW (ref 22–32)
Chloride: >130 mmol/L (ref 101–111)
Creatinine, Ser: 14.68 mg/dL — ABNORMAL HIGH (ref 0.61–1.24)
GFR, EST AFRICAN AMERICAN: 4 mL/min — AB (ref >60)
GFR, EST NON AFRICAN AMERICAN: 3 mL/min — AB (ref >60)
Glucose, Bld: 132 mg/dL — ABNORMAL HIGH (ref 65–99)
POTASSIUM: 6.4 mmol/L — AB (ref 3.5–5.1)
SODIUM: 166 mmol/L — AB (ref 135–145)

## 2015-09-08 LAB — PROCALCITONIN: Procalcitonin: 3.37 ng/mL

## 2015-09-08 LAB — PROTIME-INR
INR: 1.51 — AB (ref 0.00–1.49)
Prothrombin Time: 18.3 seconds — ABNORMAL HIGH (ref 11.6–15.2)

## 2015-09-08 LAB — I-STAT CG4 LACTIC ACID, ED
LACTIC ACID, VENOUS: 1.6 mmol/L (ref 0.5–1.9)
LACTIC ACID, VENOUS: 1.44 mmol/L (ref 0.5–1.9)

## 2015-09-08 LAB — I-STAT ARTERIAL BLOOD GAS, ED
Acid-base deficit: 10 mmol/L — ABNORMAL HIGH (ref 0.0–2.0)
Bicarbonate: 14.5 mEq/L — ABNORMAL LOW (ref 20.0–24.0)
O2 Saturation: 100 %
PCO2 ART: 28.5 mmHg — AB (ref 35.0–45.0)
PH ART: 7.316 — AB (ref 7.350–7.450)
PO2 ART: 181 mmHg — AB (ref 80.0–100.0)
Patient temperature: 98.6
TCO2: 15 mmol/L (ref 0–100)

## 2015-09-08 LAB — APTT: aPTT: 35 seconds (ref 24–37)

## 2015-09-08 LAB — LACTIC ACID, PLASMA: Lactic Acid, Venous: 1.3 mmol/L (ref 0.5–1.9)

## 2015-09-08 LAB — CBG MONITORING, ED: Glucose-Capillary: 108 mg/dL — ABNORMAL HIGH (ref 65–99)

## 2015-09-08 LAB — CORTISOL: Cortisol, Plasma: 27.3 ug/dL

## 2015-09-08 LAB — OSMOLALITY: Osmolality: 449 mOsm/kg (ref 275–295)

## 2015-09-08 LAB — PHOSPHORUS: Phosphorus: 8.8 mg/dL — ABNORMAL HIGH (ref 2.5–4.6)

## 2015-09-08 LAB — MRSA PCR SCREENING: MRSA by PCR: NEGATIVE

## 2015-09-08 LAB — STREP PNEUMONIAE URINARY ANTIGEN: STREP PNEUMO URINARY ANTIGEN: NEGATIVE

## 2015-09-08 MED ORDER — ALBUTEROL SULFATE (2.5 MG/3ML) 0.083% IN NEBU
10.0000 mg | INHALATION_SOLUTION | Freq: Once | RESPIRATORY_TRACT | Status: AC
  Administered 2015-09-08: 10 mg via RESPIRATORY_TRACT
  Filled 2015-09-08: qty 12

## 2015-09-08 MED ORDER — SODIUM POLYSTYRENE SULFONATE 15 GM/60ML PO SUSP
60.0000 g | Freq: Once | ORAL | Status: AC
  Administered 2015-09-08: 60 g
  Filled 2015-09-08: qty 240

## 2015-09-08 MED ORDER — DEXTROSE 50 % IV SOLN
1.0000 | Freq: Once | INTRAVENOUS | Status: AC
  Administered 2015-09-08: 50 mL via INTRAVENOUS
  Filled 2015-09-08: qty 50

## 2015-09-08 MED ORDER — INSULIN ASPART 100 UNIT/ML ~~LOC~~ SOLN: 0.0000 [IU] | SUBCUTANEOUS | Status: DC

## 2015-09-08 MED ORDER — MORPHINE SULFATE (PF) 2 MG/ML IV SOLN
1.0000 mg | INTRAVENOUS | Status: DC | PRN
  Administered 2015-09-08 – 2015-09-09 (×6): 2 mg via INTRAVENOUS
  Administered 2015-09-09: 4 mg via INTRAVENOUS
  Administered 2015-09-09: 2 mg via INTRAVENOUS
  Administered 2015-09-10: 4 mg via INTRAVENOUS
  Filled 2015-09-08: qty 1
  Filled 2015-09-08: qty 2
  Filled 2015-09-08 (×3): qty 1
  Filled 2015-09-08: qty 2
  Filled 2015-09-08 (×3): qty 1

## 2015-09-08 MED ORDER — SODIUM CHLORIDE 0.9 % IV SOLN
1.0000 g | Freq: Once | INTRAVENOUS | Status: AC
  Administered 2015-09-08: 1 g via INTRAVENOUS
  Filled 2015-09-08: qty 10

## 2015-09-08 MED ORDER — SODIUM CHLORIDE 0.9 % IV BOLUS (SEPSIS)
500.0000 mL | Freq: Once | INTRAVENOUS | Status: AC
  Administered 2015-09-08: 500 mL via INTRAVENOUS

## 2015-09-08 MED ORDER — DEXTROSE 5 % IV SOLN
INTRAVENOUS | Status: DC
  Administered 2015-09-08: 14:00:00 via INTRAVENOUS
  Filled 2015-09-08 (×2): qty 150

## 2015-09-08 MED ORDER — INSULIN ASPART 100 UNIT/ML IV SOLN
10.0000 [IU] | Freq: Once | INTRAVENOUS | Status: AC
  Administered 2015-09-08: 10 [IU] via INTRAVENOUS
  Filled 2015-09-08: qty 1

## 2015-09-08 MED ORDER — SODIUM CHLORIDE 0.9 % IV SOLN: INTRAVENOUS | Status: DC

## 2015-09-08 NOTE — ED Notes (Signed)
Attempted to call mother with no answer. Called Riesa PopeWinston Smith, pt's friend, He answered and stated that he was on the road in [place]. He spoke with Pt's mother this morning.

## 2015-09-08 NOTE — ED Notes (Signed)
CBG 108. 

## 2015-09-08 NOTE — ED Notes (Signed)
RT at the bedside.

## 2015-09-08 NOTE — ED Notes (Signed)
MD Ray at the bedside. 

## 2015-09-08 NOTE — ED Notes (Signed)
Called Pharmacy about medication

## 2015-09-08 NOTE — ED Provider Notes (Signed)
CSN: [id number]     Arrival date & time 09-17-15  0810 History   First MD Initiated Contact with Patient 09/17/15 [phone]     Chief Complaint  Patient presents with  . Altered Mental Status    Level 5 caveat secondary to baseline inability to communicate after stroke in past HPI   50 year old male sent from skilled nursing facility with report that he is less responsive than usual. They state that he normally answers with yes no or to name. They report that he was in his normal mental status last night at 11 PM and then was unresponsive on their assessment in the morning. He is full care at baseline.  Past Medical History  Diagnosis Date  . Hypertension   . Tachycardia   . AAA (abdominal aortic aneurysm) (HCC)   . Tachypnea   . Dysphagia   . Dysarthria   . Nontraumatic subcortical hemorrhage of left cerebral hemisphere [facility])    Past Surgical History  Procedure Laterality Date  . Appendectomy    . Abdominal surgery     No family history on file. Social History  Substance Use Topics  . Smoking status: Current Every Day Smoker -- 1.00 packs/day  . Smokeless tobacco: None  . Alcohol Use: Yes     Comment: 40 oz beer/day    Review of Systems  Unable to perform ROS: Patient nonverbal      Allergies  Review of patient's allergies indicates no known allergies.  Home Medications   Prior to Admission medications   Medication Sig Start Date End Date Taking? Authorizing Provider  amantadine (SYMMETREL) 50 MG/5ML solution Take 15 mLs (150 mg total) by mouth 2 (two) times daily. 08/29/15   Layne Benton, NP  amLODipine (NORVASC) 10 MG tablet Take 1 tablet (10 mg total) by mouth daily. 06/14/14   Rudean Hitt, PA-C  atorvastatin (LIPITOR) 20 MG tablet Take 1 tablet (20 mg total) by mouth daily at 6 PM. 06/14/14   Rudean Hitt, PA-C  hydrALAZINE (APRESOLINE) 100 MG tablet Take 1 tablet (100 mg total) by mouth every 8 (eight) hours. 08/29/15   Layne Benton, NP  lisinopril  (PRINIVIL,ZESTRIL) 20 MG tablet Take 1 tablet (20 mg total) by mouth 2 (two) times daily. 06/14/14   Rudean Hitt, PA-C  metoprolol (LOPRESSOR) 100 MG tablet Take 1 tablet (100 mg total) by mouth 2 (two) times daily. 08/29/15   Layne Benton, NP  senna-docusate (SENOKOT-S) 8.6-50 MG tablet Take 1 tablet by mouth 2 (two) times daily. 08/29/15   Layne Benton, NP   BP 104/84 mmHg  Pulse 113  Temp(Src) 98.6 F (37 C) (Rectal)  Resp 27  SpO2 100% Physical Exam  Constitutional: He appears well-developed. No distress.  HENT:  Head turned left Stable to turn head to right and neck appears supple  Eyes: Conjunctivae are normal. Pupils are equal, round, and reactive to light.  Neck: Normal range of motion.  Cardiovascular: Normal rate.   Pulmonary/Chest: Effort normal and breath sounds normal.  Abdominal: Soft. Bowel sounds are normal.  Musculoskeletal: Normal range of motion.  Neurological: He is alert.  Right upper and lower extremity flaccid.  Left toes move, dtr present left ankle and knee  Skin: Skin is warm and dry.  Psychiatric: He has a normal mood and affect. His behavior is normal.  Nursing note and vitals reviewed.   ED Course  Procedures (including critical care time) Labs Review Labs Reviewed  I-STAT ARTERIAL BLOOD  GAS, ED - Abnormal; Notable for the following:    pH, Arterial 7.316 (*)    pCO2 arterial 28.5 (*)    pO2, Arterial 181.0 (*)    Bicarbonate 14.5 (*)    Acid-base deficit 10.0 (*)    All other components within normal limits  CBC WITH DIFFERENTIAL/PLATELET  COMPREHENSIVE METABOLIC PANEL  URINALYSIS W MICROSCOPIC (NOT AT [facility])  BLOOD GAS, ARTERIAL  I-STAT CG4 LACTIC ACID, ED    Imaging Review Ct Head Wo Contrast  09/18/2015  CLINICAL DATA:  Altered mental status. Recent left thalamus hemorrhagic infarct EXAM: CT HEAD WITHOUT CONTRAST TECHNIQUE: Contiguous axial images were obtained from the base of the skull through the vertex without intravenous  contrast. COMPARISON:  August 24, 2015 FINDINGS: Brain: There is mild underlying atrophy. There is partial but incomplete resolution of hemorrhage from the left thalamus compared to recent study. There is residual hemorrhage in the left thalamus, less concentrated than on the previous study. There is surrounding cytotoxic edema in the left thalamus. The current focal area containing hemorrhage in the left thalamus measures 2.3 x 1.7 x 2.8 cm. There is localized impression on the third ventricle, essentially stable from previous study. Right ventricle is mildly deviated toward the right. Lateral ventricles remain in the midline. A small amount of hemorrhage remains in the atrium of the left lateral ventricle, much less than on the previous study. Most of the hemorrhage has resolved from the left lateral ventricle. No new hemorrhagic foci are identified. There is no demonstrable mass or extra-axial fluid collection. There is small vessel disease throughout the centra semiovale bilaterally. No new infarct evident. Vascular: There are small foci of calcification in the cavernous carotid arteries bilaterally. No hyperdense vessels are identified. Skull: The bony calvarium appears intact. Sinuses/Orbits: Orbits appear symmetric bilaterally. There is opacification in left ethmoid air cell anteriorly. Other paranasal sinuses are clear. Other: Mastoid air cells are clear. IMPRESSION: There is less hemorrhage in the left thalamus and left lateral ventricle compared to most recent study. There is evidence of diffusion of hemorrhage in the left thalamus, and currently there is only slight hemorrhage in the atrium the left lateral ventricle. There remains mild mass effect on the third ventricle with slight midline shift of the third ventricle without midline shift of structures. There is underlying atrophy with periventricular small vessel disease. No new hemorrhage or new infarct. There are foci of vascular calcification. There  is mild ethmoid sinus disease on the left anteriorly. Electronically Signed   By: Bretta BangWilliam  Woodruff III M.D.   On: 09/04/2015 09:55   I have personally reviewed and evaluated these images and lab results as part of my medical decision-making.   EKG Interpretation   Date/Time:  Saturday September 08 2015 08:10:31 EDT Ventricular Rate:  116 PR Interval:    QRS Duration: 75 QT Interval:  315 QTC Calculation: 438 R Axis:   68 Text Interpretation:  Sinus tachycardia Right atrial enlargement Left  ventricular hypertrophy peaked t waves Confirmed by Rosenda Geffrard MD, Duwayne HeckANIELLE  ([id number]) on 09/01/2015 9:50:09 AM      MDM   Final diagnoses:  Renal failure  Hyperkalemia  Hypernatremia    50 y.o. Male s/p intrathalamic hemmorhage secondary to hypertension 6/19 /17, d/c'd to snf presents today with decreased responsiveness and now hypernatremic to 166 (increased from 151 last check) with hyperkalemia.  Patient like with volume depletion with signficant tbw decrease, gradual repletion ensuing with 1 liter ns here in ed.  Patient treated for hyperkalemia  with calcium, insulin, and albuterol as ekg with peaked t waves. 1- ams-patient baseline altered due to previous ich but now with multiple electrolyte abnormalities and volume depletion 2- hypernatremia 3- hyperkalemia 4-ekg changes secondary to #3 5-renal failure- plan hydration, will place foley- rn did i and out and had 65 cc urine.  Discussed with critical care and will continue to treat volume and hyperkalemia but will hold on dialysis at this time.    CRITICAL CARE Performed by: Hilario Quarry Total critical care time: 75 minutes Critical care time was exclusive of separately billable procedures and treating other patients. Critical care was necessary to treat or prevent imminent or life-threatening deterioration. Critical care was time spent personally by me on the following activities: development of treatment plan with patient and/or surrogate as  well as nursing, discussions with consultants, evaluation of patient's response to treatment, examination of patient, obtaining history from patient or surrogate, ordering and performing treatments and interventions, ordering and review of laboratory studies, ordering and review of radiographic studies, pulse oximetry and re-evaluation of patient's condition.   Margarita Grizzle, MD 09/21/2015 [phone]

## 2015-09-08 NOTE — ED Notes (Signed)
Patient transported to US with this RN to then be taken upstairs.

## 2015-09-08 NOTE — ED Notes (Signed)
Xray at the bedside.

## 2015-09-08 NOTE — ED Notes (Signed)
Per [facility], [facility]. Facility reports patient's baseline is answering yes or no questions and stating name. Pt has Hx of Stroke 1 year ago. Patient was last seen normal last night at 2300. This morning,, facility reported increased altered mental status and abnormal respirations. Denied any urinary or bowel changes. Reports appetite has been poor since initial admission and no changes. Vitals per EMS: 104/76, 118 HR, 116 CBG, 100% NRB, 32 RR.

## 2015-09-08 NOTE — ED Notes (Signed)
Pt's critical values made aware to the MD

## 2015-09-08 NOTE — H&P (Signed)
PULMONARY / CRITICAL CARE MEDICINE   Name: Andre [name] MRN: [id number] DOB: 1966-02-02    ADMISSION DATE:  09/06/2015   REFERRING MD:  EDP  CHIEF COMPLAINT:  Poor appetite   HISTORY OF PRESENT ILLNESS:   50 yo with an extensive past medical history, Discharged from [facility] 6/28 post stroke(left subcortical hemorrhage with rt hemiplegia) from Neurology service. He presents to [facility]  7/8 with poor appetite , decreased LOC(baseleine is 1-2 words and grips with left side and eats dys 2 diet) and is dehydrated with na 166, ARF with creatine 15, Hyperkalemic with K+ >7.5. PCCM is asked to admit to ICU as a full code. We will hydrate, correct electrolytes, monitor for neurological a nd cardiac dysfunctions. He may need dialysis if refractory to current interventions started on 09/15/2015.  PAST MEDICAL HISTORY :  He  has a past medical history of Hypertension; Tachycardia; AAA (abdominal aortic aneurysm) (HCC); Tachypnea; Dysphagia; Dysarthria; and Nontraumatic subcortical hemorrhage of left cerebral hemisphere [facility]).  PAST SURGICAL HISTORY: He  has past surgical history that includes Appendectomy and Abdominal surgery.  No Known Allergies  No current facility-administered medications on file prior to encounter.   Current Outpatient Prescriptions on File Prior to Encounter  Medication Sig  . amantadine (SYMMETREL) 50 MG/5ML solution Take 15 mLs (150 mg total) by mouth 2 (two) times daily.  Marland Kitchen amLODipine (NORVASC) 10 MG tablet Take 1 tablet (10 mg total) by mouth daily.  Marland Kitchen atorvastatin (LIPITOR) 20 MG tablet Take 1 tablet (20 mg total) by mouth daily at 6 PM.  . hydrALAZINE (APRESOLINE) 100 MG tablet Take 1 tablet (100 mg total) by mouth every 8 (eight) hours.  Marland Kitchen lisinopril (PRINIVIL,ZESTRIL) 20 MG tablet Take 1 tablet (20 mg total) by mouth 2 (two) times daily.  . metoprolol (LOPRESSOR) 100 MG tablet Take 1 tablet (100 mg total) by mouth 2 (two) times daily.  Marland Kitchen senna-docusate (SENOKOT-S)  8.6-50 MG tablet Take 1 tablet by mouth 2 (two) times daily.    FAMILY HISTORY:  His has no family status information on file.   SOCIAL HISTORY: He  reports that he has been smoking.  He does not have any smokeless tobacco history on file. He reports that he drinks alcohol. He reports that he does not use illicit drugs.  REVIEW OF SYSTEMS:  Unable to obtain given altered mentation.  SUBJECTIVE:  50 yo stroke pt with decreased loc  VITAL SIGNS: BP 115/77 mmHg  Pulse 117  Temp(Src) 98.6 F (37 C) (Rectal)  Resp 20  SpO2 99%  HEMODYNAMICS:    VENTILATOR SETTINGS:    INTAKE / OUTPUT:    PHYSICAL EXAMINATION: General: Poorly responsive AAM, thin and dehydrated. Poor skin turgor. Follows commands with left hand. Neuro:  Rt side flaccid. grips left weakly to command. Opens eyes with stimulation. HEENT: Dry mucosa. No scleral injection or icterus. Cardiovascular:  No appreciable JVD. No edema. Tachycardic with sinus rhythm on telemetry. Lungs: Decreased breath sounds bilateral lung bases. Normal work of breathing. Otherwise clear to auscultation. Abdomen: Soft. Sunken and nondistended. Normal bowel sounds. Musculoskeletal: Flaccid rt side. No joint deformity or effusion appreciated. Skin:  Warm and dry. No rash on exposed skin.  LABS:  BMET  Recent Labs Lab 09/15/2015 0820  NA 166*  K >7.5*  CL 129*  CO2 15*  BUN 275*  CREATININE 15.17*  GLUCOSE 128*    Electrolytes  Recent Labs Lab 09/18/2015 0820  CALCIUM 9.9    CBC  Recent  Labs Lab 09/20/2015 0820  WBC 7.4  HGB 14.0  HCT 44.5  PLT 136*    Coag's No results for input(s): APTT, INR in the last 168 hours.  Sepsis Markers  Recent Labs Lab 09/09/2015 0851  LATICACIDVEN 1.60    ABG  Recent Labs Lab 09/09/2015 0901  PHART 7.316*  PCO2ART 28.5*  PO2ART 181.0*    Liver Enzymes  Recent Labs Lab 09/13/2015 0820  AST 85*  ALT 154*  ALKPHOS 76  BILITOT 0.6  ALBUMIN 2.6*    Cardiac  Enzymes No results for input(s): TROPONINI, PROBNP in the last 168 hours.  Glucose  Recent Labs Lab 09/09/2015 1030  GLUCAP 108*    Imaging Ct Head Wo Contrast  09/16/2015  CLINICAL DATA:  Altered mental status. Recent left thalamus hemorrhagic infarct EXAM: CT HEAD WITHOUT CONTRAST TECHNIQUE: Contiguous axial images were obtained from the base of the skull through the vertex without intravenous contrast. COMPARISON:  August 24, 2015 FINDINGS: Brain: There is mild underlying atrophy. There is partial but incomplete resolution of hemorrhage from the left thalamus compared to recent study. There is residual hemorrhage in the left thalamus, less concentrated than on the previous study. There is surrounding cytotoxic edema in the left thalamus. The current focal area containing hemorrhage in the left thalamus measures 2.3 x 1.7 x 2.8 cm. There is localized impression on the third ventricle, essentially stable from previous study. Right ventricle is mildly deviated toward the right. Lateral ventricles remain in the midline. A small amount of hemorrhage remains in the atrium of the left lateral ventricle, much less than on the previous study. Most of the hemorrhage has resolved from the left lateral ventricle. No new hemorrhagic foci are identified. There is no demonstrable mass or extra-axial fluid collection. There is small vessel disease throughout the centra semiovale bilaterally. No new infarct evident. Vascular: There are small foci of calcification in the cavernous carotid arteries bilaterally. No hyperdense vessels are identified. Skull: The bony calvarium appears intact. Sinuses/Orbits: Orbits appear symmetric bilaterally. There is opacification in left ethmoid air cell anteriorly. Other paranasal sinuses are clear. Other: Mastoid air cells are clear. IMPRESSION: There is less hemorrhage in the left thalamus and left lateral ventricle compared to most recent study. There is evidence of diffusion of  hemorrhage in the left thalamus, and currently there is only slight hemorrhage in the atrium the left lateral ventricle. There remains mild mass effect on the third ventricle with slight midline shift of the third ventricle without midline shift of structures. There is underlying atrophy with periventricular small vessel disease. No new hemorrhage or new infarct. There are foci of vascular calcification. There is mild ethmoid sinus disease on the left anteriorly. Electronically Signed   By: Bretta Bang III M.D.   On: 09/18/2015 09:55   Dg Chest Portable 1 View  09/02/2015  CLINICAL DATA:  Altered mental status EXAM: PORTABLE CHEST 1 VIEW COMPARISON:  August 29, 2015 FINDINGS: There is no edema or consolidation. The heart size and pulmonary vascularity are normal. No adenopathy. No bone lesions. IMPRESSION: No edema or consolidation. Electronically Signed   By: Bretta Bang III M.D.   On: 09/17/2015 10:30     STUDIES:  Renal U/S 7/8: Complex cystic lesion right kidney with thickened septations. No hydronephrosis. CT HEAD W/O 7/8: Less hemorrhage in left thalamus & left lateral ventricle compared with recent study. Evidence of diffusion of hemorrhage in the left thalamus. Slight hemorrhage in atrium the left lateral ventricle. Mild mass  effect and third ventricle & slight midline shift of the third ventricle without midline shift of structures. Underlying atrophy and periventricular small vessel disease. No new hemorrhage or infarct. PORT CXR 7/8: No focal opacity or effusion. ABD X-RAY 7/8: Enteric feeding tube coursing below diaphragm with side port appearing in region of stomach.  MICROBIOLOGY: MRSA PCR 7/8>>> Blood Ctx x2 7/8>>> Urine Ctx 7/8>>>  ANTIBIOTICS: None  SIGNIFICANT EVENTS: 7/8 - Admit to [facility] with ARF  LINES/TUBES: R NGT 7/8>>> FOLEY 7/8>>> PIV x2  DISCUSSION: 50 yo post left stroke with decreased loc, acute renal failure, hyperkalemia from presumed hypovolemia ion  snf post stroke.  ASSESSMENT / PLAN:  PULMONARY A: Aspiration Risk  P:   Continuous pulse oximetry Weaning FiO2 for Sat >92%  CARDIOVASCULAR A:  Hx of AAA repair 8/16 HD stable 7/8 K+ >7.5 THEREFORE monitor for arrhythmias  P:  ICU admit Follow 12 leads  RENAL Lab Results  Component Value Date   CREATININE 15.17* 2015-10-08   CREATININE 1.85* 08/31/2015   CREATININE 1.77* 08/30/2015    Recent Labs Lab 10/08/2015 0820  K >7.5*    Recent Labs Lab 10-08-15 0820  NA 166*      A:   Acute renal failure, suspect hypovolemia as cause and was on ACE-I with poor po intake 7/8 Hyperkalemia  P:   Fluid resuscitation with careful reduction in na to prevent szs Recheck renal function 7/8 post  Hydration Place NGT and give kayexalate  Insulin and dextrose 7/8 Calcium cl 7/8 Place foley 7/8 Renal US since he had recent AAA repair 2016 If unable to correct lytes may need HD per renal Nacho3 at 100 cc/hr Check SOSM  GASTROINTESTINAL A:   Malnourishment  P:   Place NGT and start tube feeds  HEMATOLOGIC A:   Thrombocytopenia - Mild.  P:  Trending cell counts daily w/ CBC SCDs Holding on chemical DVT ppx given recent ICH  INFECTIOUS A:   No acute process.  P:   Monitor off antibiotics Monitor for signs of infection  ENDOCRINE A:   Hyperglycemia - No h/o DM.    P:   Accu-Checks q4hr SSI per Sensitive Algorithm Checking Hgb A1c  NEUROLOGIC A:   Subcortical stroke left cerebral hemisphere on 6/19. Dc to SNF on 6/28 on dsf 2 diet Less responsive 7/8 and taken to ER [facility]. CT head 7/8 no new injury SZS precaution with na 166 P:   RASS goal:0 Baseline one or two words. Rt hemiplegia Careful with sedation Place NGT for meds and foods   FAMILY  - Updates:  No family at bedside  - Inter-disciplinary family meet or Palliative Care meeting due by:  July 15th   Brett Canales Minor ACNP Adolph Pollack PCCM Pager [phone] till 3 pm If no answer page  [phone] 10-08-15, 12:11 PM  PCCM Attending Note: Patient seen and examined with nurse practitioner. Please refer to his admission H&P which I reviewed in detail. Patient presented with altered mental status, hyperkalemia, metabolic acidosis, acute on chronic renal failure, & recent intracerebral hemorrhage. I had a lengthy discussion with the patient's mother this afternoon regarding his clinical state. She reports that he gradually declined such that he would not even take an oral sustenance. She believes the patient will likely die and does not wish to prolong the inevitable or cause the patient any suffering. I feel this is quite reasonable. As such the patient will be made a full DO NOT RESUSCITATE. I have discontinued his IV fluids  in an effort to prevent pulmonary edema and worsening of his respiratory status. I have ordered IV morphine for any signs of pain or dyspnea. The patient will transfer to the palliative medicine floor. Hospitalist service will assume primary care and I have personally contacted the palliative medicine service to provide further consultation and care for the patient moving forward. The patient's mother is contemplating a local funeral home and [place]. I informed her that we would update her if his clinical status changed.  I have spent a total of 39 minutes of critical care time today caring for the patient, discussing the plan of care with consultants as well as with the patient's mother at bedside, and reviewing the patient's electronic medical record.  Donna ChristenJennings E. Jamison NeighborNestor, M.D. [facility] Pager:  [phone] After 3pm or if no response, call [phone] 4:59 PM 09/07/2015

## 2015-09-09 DIAGNOSIS — E870 Hyperosmolality and hypernatremia: Secondary | ICD-10-CM | POA: Insufficient documentation

## 2015-09-09 DIAGNOSIS — R0600 Dyspnea, unspecified: Secondary | ICD-10-CM | POA: Insufficient documentation

## 2015-09-09 DIAGNOSIS — E875 Hyperkalemia: Secondary | ICD-10-CM | POA: Insufficient documentation

## 2015-09-09 DIAGNOSIS — Z515 Encounter for palliative care: Secondary | ICD-10-CM

## 2015-09-09 DIAGNOSIS — N19 Unspecified kidney failure: Secondary | ICD-10-CM

## 2015-09-09 DIAGNOSIS — L8990 Pressure ulcer of unspecified site, unspecified stage: Secondary | ICD-10-CM

## 2015-09-09 MED ORDER — DIPHENHYDRAMINE HCL 50 MG/ML IJ SOLN: 12.5000 mg | INTRAMUSCULAR | Status: DC | PRN

## 2015-09-09 MED ORDER — GLYCOPYRROLATE 0.2 MG/ML IJ SOLN
0.2000 mg | INTRAMUSCULAR | Status: DC | PRN
  Administered 2015-09-09: 0.2 mg via INTRAVENOUS
  Filled 2015-09-09: qty 1

## 2015-09-09 MED ORDER — LORAZEPAM 2 MG/ML IJ SOLN: 1.0000 mg | INTRAMUSCULAR | Status: DC | PRN

## 2015-09-09 MED ORDER — LORAZEPAM 1 MG PO TABS: 1.0000 mg | ORAL_TABLET | ORAL | Status: DC | PRN

## 2015-09-09 MED ORDER — MORPHINE SULFATE (CONCENTRATE) 10 MG/0.5ML PO SOLN: 5.0000 mg | ORAL | Status: DC | PRN

## 2015-09-09 MED ORDER — ONDANSETRON HCL 4 MG/2ML IJ SOLN: 4.0000 mg | Freq: Four times a day (QID) | INTRAMUSCULAR | Status: DC | PRN

## 2015-09-09 MED ORDER — BIOTENE DRY MOUTH MT LIQD: 15.0000 mL | OROMUCOSAL | Status: DC | PRN

## 2015-09-09 MED ORDER — ONDANSETRON 4 MG PO TBDP: 4.0000 mg | ORAL_TABLET | Freq: Four times a day (QID) | ORAL | Status: DC | PRN

## 2015-09-09 MED ORDER — SODIUM CHLORIDE 0.9 % IV SOLN: 12.5000 mg | Freq: Four times a day (QID) | INTRAVENOUS | Status: DC | PRN

## 2015-09-09 MED ORDER — ACETAMINOPHEN 650 MG RE SUPP: 650.0000 mg | Freq: Four times a day (QID) | RECTAL | Status: DC | PRN

## 2015-09-09 MED ORDER — ACETAMINOPHEN 325 MG PO TABS
650.0000 mg | ORAL_TABLET | Freq: Four times a day (QID) | ORAL | Status: DC | PRN
Start: 2015-09-09 — End: 2015-09-10

## 2015-09-09 MED ORDER — LORAZEPAM 2 MG/ML PO CONC: 1.0000 mg | ORAL | Status: DC | PRN

## 2015-09-09 MED ORDER — IPRATROPIUM-ALBUTEROL 0.5-2.5 (3) MG/3ML IN SOLN
3.0000 mL | Freq: Four times a day (QID) | RESPIRATORY_TRACT | Status: DC
  Administered 2015-09-09 – 2015-09-10 (×3): 3 mL via RESPIRATORY_TRACT
  Filled 2015-09-09 (×3): qty 3

## 2015-09-09 NOTE — Consult Note (Signed)
Consultation Note Date: 09/09/2015   Patient Name: Andre [name]  DOB: 02/09/1966  MRN: [id number]  Age / Sex: 50 y.o., male  PCP: No primary care provider on file. Referring Physician: Richarda Overlie, MD  Reason for Consultation: Terminal Care  HPI/Patient Profile: 50 y.o. male  with extensive past medical history Including anal failure, hypertension, AAA, tachypnea, dysphasia, subcortical hemorrhage left cerebral hemisphere [facility] 6/28  admitted on 09/19/2015 from skilled facility with altered mental status, hyperkalemia, metabolic acidosis, acute on chronic renal failure, and recent history of cerebral hemorrhage.  Mother discussed with critical care team and he has continued to have decline in his nutrition, functional status, and cognition. His mother knows that he has reached the end of his life and does not want to cause any suffering. He is currently full comfort care. Palliative consulted to assist in comfort care and also family support.   Clinical Assessment and Goals of Care: I saw and examined Andre [name] today. He remains unresponsive. He does not respond to verbal or tactile stimulation. He was resting comfortably.  I called and spoke at length with his mother via phone from his room. We talked about the fact that he has had a long course with multiple medical problems and he is now approaching the end of his life. His mother states that she understands his time is short and wants to ensure that he is comfortable moving forward. She states the doctors have been doing a good job talking with her and she has no further questions at this time.  SUMMARY OF RECOMMENDATIONS   - I called and updated patient's mother on his condition. He is resting comfortably at time of my exam. His bedside RN and I talked about his care. Only concern is intermittently elevated  respiratory rate (~28) and will plan for dose of morphine if this recurs. - Full comfort care  Code Status/Advance Care Planning:  DNR   Symptom Management:   Symptoms currently well managed. Plan to continue same  Pain/shortness of breath: Morphine as needed  Anxiety: Lorazepam as needed  Agitation: Haldol as needed  Excess secretions: Robinul as needed  Palliative Prophylaxis:   Frequent Pain Assessment  Additional Recommendations (Limitations, Scope, Preferences):  Full Comfort Care  Psycho-social/Spiritual:   Desire for further Chaplaincy support:no  Additional Recommendations: Caregiving  Support/Resources and Grief/Bereavement Support  Prognosis:   Hours - Days  Discharge Planning: Hospital death is likely. If his condition were to stabilize enough to consider transition from hospital, I think he would best be served by residential hospice placement.      Primary Diagnoses: Present on Admission:  . Renal failure  I have reviewed the medical record, interviewed the patient and family, and examined the patient. The following aspects are pertinent.  Past Medical History  Diagnosis Date  . Hypertension   . Tachycardia   . AAA (abdominal aortic aneurysm) (HCC)   . Tachypnea   . Dysphagia   . Dysarthria   .  Nontraumatic subcortical hemorrhage of left cerebral hemisphere [facility])    Social History   Social History  . Marital Status: Legally Separated    Spouse Name: N/A  . Number of Children: N/A  . Years of Education: N/A   Social History Main Topics  . Smoking status: Current Every Day Smoker -- 1.00 packs/day  . Smokeless tobacco: None  . Alcohol Use: Yes     Comment: 40 oz beer/day  . Drug Use: No  . Sexual Activity: Not Asked   Other Topics Concern  . None   Social History Narrative   No family history on file. Scheduled Meds: . ipratropium-albuterol  3 mL Nebulization Q6H   Continuous Infusions:  PRN Meds:.acetaminophen **OR**  acetaminophen, antiseptic oral rinse, chlorproMAZINE (THORAZINE) IV, diphenhydrAMINE, LORazepam **OR** LORazepam **OR** LORazepam, morphine injection, morphine CONCENTRATE **OR** morphine CONCENTRATE, ondansetron **OR** ondansetron (ZOFRAN) IV Medications Prior to Admission:  Prior to Admission medications   Medication Sig Start Date End Date Taking? Authorizing Provider  amantadine (SYMMETREL) 50 MG/5ML solution Take 15 mLs (150 mg total) by mouth 2 (two) times daily. 08/29/15  Yes Layne Benton, NP  amLODipine (NORVASC) 10 MG tablet Take 1 tablet (10 mg total) by mouth daily. 06/14/14  Yes Rudean Hitt, PA-C  atorvastatin (LIPITOR) 20 MG tablet Take 1 tablet (20 mg total) by mouth daily at 6 PM. 06/14/14  Yes Rudean Hitt, PA-C  hydrALAZINE (APRESOLINE) 100 MG tablet Take 1 tablet (100 mg total) by mouth every 8 (eight) hours. 08/29/15  Yes Layne Benton, NP  lisinopril (PRINIVIL,ZESTRIL) 20 MG tablet Take 1 tablet (20 mg total) by mouth 2 (two) times daily. 06/14/14   Rudean Hitt, PA-C  metoprolol (LOPRESSOR) 100 MG tablet Take 1 tablet (100 mg total) by mouth 2 (two) times daily. 08/29/15   Layne Benton, NP  senna-docusate (SENOKOT-S) 8.6-50 MG tablet Take 1 tablet by mouth 2 (two) times daily. 08/29/15   Layne Benton, NP   No Known Allergies Review of Systems  Unable to perform ROS: Mental status change   Physical Exam General: unresponsive, does not respond to verbal or tactile stimulation HEENT: No bruits, no goiter, no JVD Heart: Regular rate and rhythm. No murmur appreciated. Lungs: Fair movement, clear Abdomen: Soft, nontender, nondistended, positive bowel sounds.  Ext: No significant edema Skin: Warm and dry Neuro: Unresponsive   Vital Signs: BP 99/65 mmHg  Pulse 125  Temp(Src) 98.8 F (37.1 C) (Axillary)  Resp 22  Wt 58.968 kg (130 lb)  SpO2 100% Pain Assessment: PAINAD   Pain Score: 1    SpO2: SpO2: 100 % O2 Device:SpO2: 100 % O2 Flow Rate: .O2 Flow  Rate (L/min): 2 L/min  IO: Intake/output summary:  Intake/Output Summary (Last 24 hours) at 09/09/15 1535 Last data filed at 09/09/15 1610  Gross per 24 hour  Intake    130 ml  Output    110 ml  Net     20 ml    LBM: Last BM Date: 09/16/2015 Baseline Weight: Weight: 58.968 kg (130 lb) Most recent weight: Weight: 58.968 kg (130 lb)     Palliative Assessment/Data: 10%   Flowsheet Rows        Most Recent Value   Intake Tab    Referral Department  Critical care   Unit at Time of Referral  ICU   Palliative Care Primary Diagnosis  Neurology   Date Notified  09/17/2015   Palliative Care Type  New Palliative care  Date of Admission  09/20/2015   Date first seen by Palliative Care  09/09/15   # of days Palliative referral response time  1 Day(s)   # of days IP prior to Palliative referral  0   Clinical Assessment    Palliative Performance Scale Score  10%   Pain Max last 24 hours  Not able to report   Pain Min Last 24 hours  Not able to report   Psychosocial & Spiritual Assessment    Palliative Care Outcomes    Patient/Family meeting held?  No   Palliative Care Outcomes  Improved pain interventions, Improved non-pain symptom therapy, Provided psychosocial or spiritual support, Counseled regarding hospice      Time In: 1220 Time Out: 1305 Time Total: 45 Greater than 50%  of this time was spent counseling and coordinating care related to the above assessment and plan.  Signed by: Romie MinusGene Shaday Rayborn, MD   Please contact Palliative Medicine Team phone at [phone] for questions and concerns.  For individual provider: See Loretha StaplerAmion

## 2015-09-09 NOTE — Progress Notes (Signed)
[facility] PROGRESS NOTE  Andre [name] BJY:[id number] DOB: 01-29-1966 DOA: 07-Oct-2015   PCP: No primary care provider on file.        Brief summary 50 yo with an extensive past medical history, Discharged from [facility] to [facility] 6/28 post stroke(left subcortical hemorrhage with rt hemiplegia) from Neurology service. He presents to [facility] 7/8 with poor appetite , decreased LOC(baseleine is 1-2 words and grips with left side and eats dys 2 diet) and is dehydrated with na 166, ARF with creatine 15, Hyperkalemic with K+ >7.5. PCCM is asked to admit to ICU as a full code. Dr.Jennings E. Jamison Neighbor, M.D had a lengthy discussion with the patient's mother. Based on this discussion the patient was changed to a DO NOT RESUSCITATE. Discontinued IV fluids. Started on IV morphine for signs of pain and dyspnea. Transferred to palliative care floor. Palliative care has been consulted.  Assessment and plan   Renal failure   Pressure ulcer   Hyperkalemia   Hypernatremia  Plan -Continue conservative measures Currently only on prn morphine Will start palliative care orders set, palliative care consultation has been ordered   DVT prophylaxsis none  Code Status:  DO NOT RESUSCITATE     Family Communication: called  patient's mother and left her a voicemail,   Disposition Plan:  Continue palliative care measures      Consultants:  Palliative care  Procedures:  None  Antibiotics: Anti-infectives    None         HPI/Subjective: Patient somnolent, unarousable  Objective: Filed Vitals:   Oct 07, 2015 1617 10/07/15 1700 2015-10-07 1859 09/09/15 0636  BP:  117/85 117/85 99/65  Pulse:  117 119 125  Temp: 97.5 F (36.4 C)  97.5 F (36.4 C) 98.8 F (37.1 C)  TempSrc: Oral  Axillary Axillary  Resp:  21  22  Weight:   58.968 kg (130 lb)   SpO2:  94% 98% 100%    Intake/Output Summary (Last 24 hours) at 09/09/15 1133 Last data filed at 09/09/15 0865  Gross per 24 hour  Intake  218.33 ml  Output    175 ml  Net  43.33 ml    Exam:  Examination:  General exam: Unresponsive  Respiratory system: Clear to auscultation. Respiratory effort normal. Cardiovascular system: S1 & S2 heard, RRR. No JVD, murmurs, rubs, gallops or clicks. No pedal edema. Gastrointestinal system: Abdomen is nondistended, soft and nontender. No organomegaly or masses felt. Normal bowel sounds heard. Central nervous system: Confused. No focal neurological deficits. Extremities: Symmetric 5 x 5 power. Skin: No rashes, lesions or ulcers Psychiatry: Judgement and insight impaired.     Data Reviewed: I have personally reviewed following labs and imaging studies  Micro Results Recent Results (from the past 240 hour(s))  MRSA PCR Screening     Status: None   Collection Time: 10-07-2015  2:37 PM  Result Value Ref Range Status   MRSA by PCR NEGATIVE NEGATIVE Final    Comment:        The GeneXpert MRSA Assay (FDA approved for NASAL specimens only), is one component of a comprehensive MRSA colonization surveillance program. It is not intended to diagnose MRSA infection nor to guide or monitor treatment for MRSA infections.   Urine culture     Status: Abnormal (Preliminary result)   Collection Time: 10/07/15  3:39 PM  Result Value Ref Range Status   Specimen Description URINE, CATHETERIZED  Final   Special Requests NONE  Final   Culture >=100,000  COLONIES/mL ESCHERICHIA COLI (A)  Final   Report Status PENDING  Incomplete    Radiology Reports Ct Angio Head W/cm &/or Wo Cm  08/20/2015  ADDENDUM REPORT: 08/20/2015 07:10 ADDENDUM: Additional observations with this study: The acute left thalamic parenchymal hemorrhage is not significantly changed in size relative to previous exam from earlier today, measuring 19 x 32 x 32 mm (estimated volume 9.7 cc). Similar localized vasogenic edema and mass effect. Intraventricular extension with the preponderance of intraventricular blood within the left  lateral ventricle is similar, although smaller amounts of blood are present within the right lateral ventricle, third ventricle, with subsequent extension through the cerebral aqueduct into the fourth ventricle, stable. Localized left-to-right shift of approximately 5 mm and ventricular dilatation is also stable. Electronically Signed   By: Rise Mu M.D.   On: 08/20/2015 07:10  08/20/2015  CLINICAL DATA:  Initial valuation for acute intracranial hemorrhage. EXAM: CT ANGIOGRAPHY HEAD TECHNIQUE: Multidetector CT imaging of the head was performed using the standard protocol during bolus administration of intravenous contrast. Multiplanar CT image reconstructions and MIPs were obtained to evaluate the vascular anatomy. CONTRAST:  100 cc of Isovue 370. COMPARISON:  Prior CT from earlier the same day as well as prior MRA from 06/11/2014. FINDINGS: CTA HEAD Anterior circulation: Visualized distal cervical segments of the internal carotid arteries are widely patent. Petrous, cavernous, and supraclinoid segments widely patent without flow-limiting stenosis. Minimal focal plaque within the cavernous left ICA noted. A1 segments, anterior communicating artery common anterior cerebral arteries well opacified. M1 segments widely patent without stenosis or occlusion. MCA bifurcations normal. Distal MCA branches well opacified and symmetric. Posterior circulation: Vertebral arteries patent to the vertebrobasilar junction. Posterior inferior cerebral arteries patent. Basilar artery widely patent. Superior cerebellar and posterior cerebral arteries well opacified bilaterally. Venous sinuses: Patent without evidence for venous sinus thrombosis. Anatomic variants: No anatomic variant. No aneurysm or vascular malformation. Delayed phase:No pathologic enhancement. IMPRESSION: 1. Mild atheromatous irregularity within the cavernous ICAs bilaterally without significant stenosis. 2. Otherwise negative CTA of the neck. No  high-grade or correctable stenosis. No large vessel occlusion. No aneurysm or vascular malformation. Electronically Signed: By: Rise Mu M.D. On: 08/20/2015 05:00   Dg Abd 1 View  09/19/2015  CLINICAL DATA:  Enteric tube placement EXAM: ABDOMEN - 1 VIEW COMPARISON:  08/23/2015 abdominal radiograph FINDINGS: Enteric tube terminates in the body of the stomach. No dilated small bowel loops. Minimal colonic stool. No evidence of pneumatosis or pneumoperitoneum. IMPRESSION: Enteric tube terminates in the body of the stomach. Nonobstructive bowel gas pattern. Electronically Signed   By: Delbert Phenix M.D.   On: 09/27/2015 14:19   Ct Head Wo Contrast  09/07/2015  CLINICAL DATA:  Altered mental status. Recent left thalamus hemorrhagic infarct EXAM: CT HEAD WITHOUT CONTRAST TECHNIQUE: Contiguous axial images were obtained from the base of the skull through the vertex without intravenous contrast. COMPARISON:  August 24, 2015 FINDINGS: Brain: There is mild underlying atrophy. There is partial but incomplete resolution of hemorrhage from the left thalamus compared to recent study. There is residual hemorrhage in the left thalamus, less concentrated than on the previous study. There is surrounding cytotoxic edema in the left thalamus. The current focal area containing hemorrhage in the left thalamus measures 2.3 x 1.7 x 2.8 cm. There is localized impression on the third ventricle, essentially stable from previous study. Right ventricle is mildly deviated toward the right. Lateral ventricles remain in the midline. A small amount of hemorrhage remains in the atrium  of the left lateral ventricle, much less than on the previous study. Most of the hemorrhage has resolved from the left lateral ventricle. No new hemorrhagic foci are identified. There is no demonstrable mass or extra-axial fluid collection. There is small vessel disease throughout the centra semiovale bilaterally. No new infarct evident. Vascular: There  are small foci of calcification in the cavernous carotid arteries bilaterally. No hyperdense vessels are identified. Skull: The bony calvarium appears intact. Sinuses/Orbits: Orbits appear symmetric bilaterally. There is opacification in left ethmoid air cell anteriorly. Other paranasal sinuses are clear. Other: Mastoid air cells are clear. IMPRESSION: There is less hemorrhage in the left thalamus and left lateral ventricle compared to most recent study. There is evidence of diffusion of hemorrhage in the left thalamus, and currently there is only slight hemorrhage in the atrium the left lateral ventricle. There remains mild mass effect on the third ventricle with slight midline shift of the third ventricle without midline shift of structures. There is underlying atrophy with periventricular small vessel disease. No new hemorrhage or new infarct. There are foci of vascular calcification. There is mild ethmoid sinus disease on the left anteriorly. Electronically Signed   By: Bretta Bang III M.D.   On: 09/12/2015 09:55   Ct Head Wo Contrast  08/24/2015  CLINICAL DATA:  Continued surveillance thalamic hematoma. EXAM: CT HEAD WITHOUT CONTRAST TECHNIQUE: Contiguous axial images were obtained from the base of the skull through the vertex without intravenous contrast. COMPARISON:  08/22/2015. FINDINGS: LEFT thalamic hematoma is redemonstrated, roughly unchanged in size measuring 18 x 33 mm cross-section. Mild surrounding edema. LEFT-to-RIGHT shift of 4 mm. Intraventricular hemorrhage primarily on the LEFT. Stable mild hydrocephalus, slight fullness of the temporal horns, stable. No new parenchymal hemorrhage. Calvarium intact.  No layering sinus or mastoid fluid. IMPRESSION: LEFT thalamic hematoma is stable. Mild hydrocephalus, without significant worsening. Electronically Signed   By: Elsie Stain M.D.   On: 08/24/2015 13:52   Ct Head Wo Contrast  08/22/2015  CLINICAL DATA:  Follow-up intracranial hemorrhage.  EXAM: CT HEAD WITHOUT CONTRAST TECHNIQUE: Contiguous axial images were obtained from the base of the skull through the vertex without intravenous contrast. COMPARISON:  CT HEAD August 21, 2015 FINDINGS: INTRACRANIAL CONTENTS: Evolving 33 x 19 mm LEFT thalamic to corona radiata intraparenchymal hematoma with surrounding low-density vasogenic edema. Intraventricular extension of hemorrhage, with LEFT greater than RIGHT lateral ventricle blood products, blood products in the third and fourth ventricles similar to prior CT. 5 mm LEFT-to-RIGHT midline shift. Mild similar ventriculomegaly with periventricular white matter hypodensities. No acute large vascular territory infarct. Mildly effaced basal cisterns. ORBITS: The included ocular globes and orbital contents are normal. SINUSES: Minimal LEFT anterior ethmoid mucosal thickening. Mastoid air cells are well aerated. SKULL/SOFT TISSUES: No skull fracture. No significant soft tissue swelling. IMPRESSION: Evolving LEFT thalamus hematoma with intraventricular extension. Stable mild hydrocephalus and interstitial edema. Stable 5 mm LEFT-to-RIGHT midline shift. Electronically Signed   By: Awilda Metro M.D.   On: 08/22/2015 02:06   Ct Head Wo Contrast  08/21/2015  CLINICAL DATA:  Follow-up intracranial hemorrhage. History of hypertension. EXAM: CT HEAD WITHOUT CONTRAST TECHNIQUE: Contiguous axial images were obtained from the base of the skull through the vertex without intravenous contrast. COMPARISON:  CT HEAD August 20, 2015 FINDINGS: INTRACRANIAL CONTENTS: Evolving 3.2 x 2.2 cm relatively stable LEFT thalamus to corona radiata intraparenchymal hematoma with surrounding low-density vasogenic edema. Intraventricular extension, with partially effaced third ventricle, stable 5 mm LEFT-to-RIGHT midline shift. Confluent periventricular white  matter hypodensities with mild dilatation RIGHT lateral ventricle. Mild sulcal effacement of the convexities. Mild basilar cistern  effacement. Minimal calcific atherosclerosis of the carotid bulbs. ORBITS: The included ocular globes and orbital contents are normal. SINUSES: The mastoid aircells and included paranasal sinuses are well-aerated. SKULL/SOFT TISSUES: No skull fracture. No significant soft tissue swelling. IMPRESSION: Evolving LEFT thalamus hematoma with intraventricular extension. Mild hydrocephalus and periventricular hypodensity/interstitial edema with mild RIGHT ventricular entrapment. Stable 5 mm LEFT-to-RIGHT midline shift. Electronically Signed   By: Awilda Metro M.D.   On: 08/21/2015 04:05   Ct Head Wo Contrast  08/20/2015  CLINICAL DATA:  50 year old male with intoxication with alcohol. History of hypertension. EXAM: CT HEAD WITHOUT CONTRAST TECHNIQUE: Contiguous axial images were obtained from the base of the skull through the vertex without intravenous contrast. COMPARISON:  Head CT dated 06/12/2014 FINDINGS: There is a 3.4 x 2.3 cm left thalamic hemorrhage with intraventricular extension. Blood fills the left lateral ventricle, third ventricle as well as the fourth ventricle. There is mass effect and approximately 5 mm left-to-right midline shift. There is mild periventricular and deep white matter chronic microvascular ischemic changes, advanced for the patient's age. The visualized paranasal sinuses and mastoid air cells are clear. The calvarium is intact. IMPRESSION: Left thalamic hemorrhage with extension into the intraventricular system and approximately 5 mm left-to-right midline shift. These results were called by telephone at the time of interpretation on 08/20/2015 at 2:55 am to Dr. Melene Plan , who verbally acknowledged these results. Electronically Signed   By: Elgie Collard M.D.   On: 08/20/2015 02:59   US Renal  09/15/2015  CLINICAL DATA:  Hypertension, renal failure. EXAM: RENAL / URINARY TRACT ULTRASOUND COMPLETE COMPARISON:  None. FINDINGS: Right Kidney: Length: 14.3 small cm. Multiple septated  cystic lesions within the RIGHT kidney measuring up to 6.7 cm. Septations are slightly thickened and mildly nodular with minimal blood flow. No hydronephrosis. Left Kidney: Length: 11.6 cm. 4.6 cm cystic lesion of the LEFT kidney is anechoic without septation. Smaller 2 cm cystic lesion. Bladder: Appears normal for degree of bladder distention. IMPRESSION: 1. Complex cystic lesion of the RIGHT kidney with thickened septations. Recommend CT or MRI with without contrast for further characterization to exclude cystic renal neoplasm. No comparison available. 2. No hydronephrosis. Electronically Signed   By: Genevive Bi M.D.   On: 09/23/2015 14:54   Dg Chest Portable 1 View  09/29/2015  CLINICAL DATA:  Altered mental status EXAM: PORTABLE CHEST 1 VIEW COMPARISON:  August 29, 2015 FINDINGS: There is no edema or consolidation. The heart size and pulmonary vascularity are normal. No adenopathy. No bone lesions. IMPRESSION: No edema or consolidation. Electronically Signed   By: Bretta Bang III M.D.   On: 09/09/2015 10:30   Dg Chest Port 1 View  08/29/2015  CLINICAL DATA:  50 year old male with tachypnea EXAM: PORTABLE CHEST 1 VIEW COMPARISON:  Chest radiograph dated 08/24/2015 FINDINGS: The heart size and mediastinal contours are within normal limits. Both lungs are clear. The visualized skeletal structures are unremarkable. IMPRESSION: No active disease. Electronically Signed   By: Elgie Collard M.D.   On: 08/29/2015 19:54   Dg Chest Port 1 View  08/24/2015  CLINICAL DATA:  Aspiration EXAM: PORTABLE CHEST 1 VIEW COMPARISON:  08/21/2015 FINDINGS: Heart and mediastinal contours are within normal limits. No focal opacities or effusions. No acute bony abnormality. Feeding tube is noted in place, projecting off the lower aspect of the image. Tip is not visualized. IMPRESSION: No active  disease. Electronically Signed   By: Charlett Nose M.D.   On: 08/24/2015 14:30   Dg Chest Port 1 View  08/21/2015  CLINICAL  DATA:  Hypertension for 2 days. EXAM: PORTABLE CHEST 1 VIEW COMPARISON:  None. FINDINGS: The heart is enlarged. There is mild tortuosity of the thoracic aorta. The pulmonary hila appear normal. The lungs are clear. No pleural effusion. The bony thorax is intact. IMPRESSION: Mild cardiac enlargement.  No acute pulmonary findings. Electronically Signed   By: Rudie Meyer M.D.   On: 08/21/2015 20:56   Dg Abd Portable 1v  08/23/2015  CLINICAL DATA:  50 year old male with NG tube placement EXAM: PORTABLE ABDOMEN - 1 VIEW COMPARISON:  Abdominal radiograph dated 08/23/2015 FINDINGS: An enteric tube is noted extending into the right hemi abdomen with tip adjacent to the right transverse process of the L4, likely at the junction of the second and third portion of the duodenum. There is no bowel dilatation or evidence of obstruction. Moderate stool noted throughout the colon. No no free air. No radiopaque calculi. There is degenerative changes of the spine. No acute fracture. IMPRESSION: Enteric tube the tip likely in the duodenum. Electronically Signed   By: Elgie Collard M.D.   On: 08/23/2015 23:32   Dg Abd Portable 1v  08/23/2015  CLINICAL DATA:  Patient status post feeding tube placement. EXAM: PORTABLE ABDOMEN - 1 VIEW COMPARISON:  Abdominal radiograph 08/22/2015. FINDINGS: Weighted feeding tube tip projects over the left upper quadrant, likely within the stomach. Nonobstructed bowel gas pattern. Lung bases are clear. Regional skeleton unremarkable. IMPRESSION: Weighted feeding tube tip projects over the stomach. Electronically Signed   By: Annia Belt M.D.   On: 08/23/2015 19:20   Dg Abd Portable 1v  08/22/2015  CLINICAL DATA:  Feeding tube placement EXAM: PORTABLE ABDOMEN - 1 VIEW COMPARISON:  None. FINDINGS: Feeding tube tip is in the descending duodenum. Nonobstructive bowel gas pattern. Visualized lung bases clear. IMPRESSION: Feeding tube tip in the distal second portion of the duodenum. Electronically  Signed   By: Charlett Nose M.D.   On: 08/22/2015 16:07   Dg Swallowing Func-speech Pathology  08/24/2015  Objective Swallowing Evaluation: Type of Study: MBS-Modified Barium Swallow Study Patient Details Name: Andre [name] MRN: [id number] Date of Birth: Jan 21, 1966 Today's Date: 08/24/2015 Time: SLP Start Time (ACUTE ONLY): 1309-SLP Stop Time (ACUTE ONLY): 1319 SLP Time Calculation (min) (ACUTE ONLY): 10 min Past Medical History: Past Medical History Diagnosis Date . Hypertension  Past Surgical History: Past Surgical History Procedure Laterality Date . Appendectomy   . Abdominal surgery   HPI: Andre [name] is a 50 y.o. male with a history of HTN who was found in the bathroom and and had vomitted. CT showed a large BG hemorrhage with extensive IVH. PMH: tobacco use, ETOH abuse, ICH posterior pons and midrain- no documentation of swallow assessment.  Subjective: pt alert, aphasic Assessment / Plan / Recommendation CHL IP CLINICAL IMPRESSIONS 08/24/2015 Therapy Diagnosis Moderate oral phase dysphagia;Moderate pharyngeal phase dysphagia Clinical Impression Pt has a mdoerate oropharyngeal dysphagia due to sensorimotor deficits. He has delayed oral transit with all consistencies and oral residue with pureed textures. Delay in swallow trigger results in silent penetration before the swallow with thin liquids, which is not cleared with weak cued throat clear. No further penetration/aspiration occurs with thicker liquids and solids, but he does have vallecular residue with purees due to reduced base of tongue retraction. Recommend Dys 1 diet and nectar thick liquids with full supervision  and strict use of aspiration precautions. Pt will need full supervision for safety, partcularly in light of his significant aphasia. Impact on safety and function Mild aspiration risk;Moderate aspiration risk   CHL IP TREATMENT RECOMMENDATION 08/24/2015 Treatment Recommendations Therapy as outlined in treatment plan below   Prognosis  08/24/2015 Prognosis for Safe Diet Advancement Good Barriers to Reach Goals Severity of deficits Barriers/Prognosis Comment -- CHL IP DIET RECOMMENDATION 08/24/2015 SLP Diet Recommendations Dysphagia 1 (Puree) solids;Nectar thick liquid Liquid Administration via Cup;Straw Medication Administration Crushed with puree Compensations Minimize environmental distractions;Slow rate;Small sips/bites;Lingual sweep for clearance of pocketing Postural Changes Remain semi-upright after after feeds/meals (Comment);Seated upright at 90 degrees   CHL IP OTHER RECOMMENDATIONS 08/24/2015 Recommended Consults -- Oral Care Recommendations Oral care BID Other Recommendations Order thickener from pharmacy;Prohibited food (jello, ice cream, thin soups);Remove water pitcher   CHL IP FOLLOW UP RECOMMENDATIONS 08/24/2015 Follow up Recommendations Inpatient Rehab   CHL IP FREQUENCY AND DURATION 08/24/2015 Speech Therapy Frequency (ACUTE ONLY) min 2x/week Treatment Duration 2 weeks      CHL IP ORAL PHASE 08/24/2015 Oral Phase Impaired Oral - Pudding Teaspoon -- Oral - Pudding Cup -- Oral - Honey Teaspoon -- Oral - Honey Cup -- Oral - Nectar Teaspoon Weak lingual manipulation;Reduced posterior propulsion;Delayed oral transit;Decreased bolus cohesion Oral - Nectar Cup -- Oral - Nectar Straw Weak lingual manipulation;Reduced posterior propulsion;Delayed oral transit;Decreased bolus cohesion Oral - Thin Teaspoon -- Oral - Thin Cup -- Oral - Thin Straw Weak lingual manipulation;Reduced posterior propulsion;Delayed oral transit;Decreased bolus cohesion Oral - Puree Weak lingual manipulation;Reduced posterior propulsion;Lingual/palatal residue;Delayed oral transit;Decreased bolus cohesion Oral - Mech Soft -- Oral - Regular -- Oral - Multi-Consistency -- Oral - Pill -- Oral Phase - Comment --  CHL IP PHARYNGEAL PHASE 08/24/2015 Pharyngeal Phase Impaired Pharyngeal- Pudding Teaspoon -- Pharyngeal -- Pharyngeal- Pudding Cup -- Pharyngeal -- Pharyngeal- Honey  Teaspoon -- Pharyngeal -- Pharyngeal- Honey Cup -- Pharyngeal -- Pharyngeal- Nectar Teaspoon Delayed swallow initiation-pyriform sinuses;Reduced anterior laryngeal mobility;Reduced laryngeal elevation;Reduced tongue base retraction Pharyngeal -- Pharyngeal- Nectar Cup -- Pharyngeal -- Pharyngeal- Nectar Straw Delayed swallow initiation-pyriform sinuses;Reduced anterior laryngeal mobility;Reduced laryngeal elevation;Reduced tongue base retraction Pharyngeal -- Pharyngeal- Thin Teaspoon -- Pharyngeal -- Pharyngeal- Thin Cup -- Pharyngeal -- Pharyngeal- Thin Straw Delayed swallow initiation-pyriform sinuses;Reduced anterior laryngeal mobility;Reduced laryngeal elevation;Reduced tongue base retraction;Penetration/Aspiration before swallow Pharyngeal Material enters airway, remains ABOVE vocal cords and not ejected out Pharyngeal- Puree Delayed swallow initiation-pyriform sinuses;Reduced anterior laryngeal mobility;Reduced laryngeal elevation;Reduced tongue base retraction;Pharyngeal residue - valleculae Pharyngeal -- Pharyngeal- Mechanical Soft -- Pharyngeal -- Pharyngeal- Regular -- Pharyngeal -- Pharyngeal- Multi-consistency -- Pharyngeal -- Pharyngeal- Pill -- Pharyngeal -- Pharyngeal Comment --  CHL IP CERVICAL ESOPHAGEAL PHASE 08/24/2015 Cervical Esophageal Phase WFL Pudding Teaspoon -- Pudding Cup -- Honey Teaspoon -- Honey Cup -- Nectar Teaspoon -- Nectar Cup -- Nectar Straw -- Thin Teaspoon -- Thin Cup -- Thin Straw -- Puree -- Mechanical Soft -- Regular -- Multi-consistency -- Pill -- Cervical Esophageal Comment -- No flowsheet data found. Maxcine Ham 08/24/2015, 2:06 PM  Maxcine Ham, M.A. CCC-SLP [phone]               CBC  Recent Labs Lab 09/13/15 0820 09/13/15 1448  WBC 7.4 6.9  HGB 14.0 14.3  HCT 44.5 46.1  PLT 136* 121*  MCV 93.3 95.4  MCH 29.4 29.6  MCHC 31.5 31.0  RDW 14.5 14.9  LYMPHSABS 1.0  --   MONOABS 0.4  --   EOSABS 0.1  --  BASOSABS 0.0  --     Chemistries    Recent Labs Lab Oct 03, 2015 0820 2015-10-03 1258 2015/10/03 1448  NA 166* 166*  --   K >7.5* 6.4*  --   CL 129* >130*  --   CO2 15* 13*  --   GLUCOSE 128* 132*  --   BUN 275* 273*  --   CREATININE 15.17* 14.68*  --   CALCIUM 9.9 10.5*  --   MG  --   --  3.7*  AST 85*  --   --   ALT 154*  --   --   ALKPHOS 76  --   --   BILITOT 0.6  --   --    ------------------------------------------------------------------------------------------------------------------ estimated creatinine clearance is 5.1 mL/min (by C-G formula based on Cr of 14.68). ------------------------------------------------------------------------------------------------------------------ No results for input(s): HGBA1C in the last 72 hours. ------------------------------------------------------------------------------------------------------------------ No results for input(s): CHOL, HDL, LDLCALC, TRIG, CHOLHDL, LDLDIRECT in the last 72 hours. ------------------------------------------------------------------------------------------------------------------ No results for input(s): TSH, T4TOTAL, T3FREE, THYROIDAB in the last 72 hours.  Invalid input(s): FREET3 ------------------------------------------------------------------------------------------------------------------ No results for input(s): VITAMINB12, FOLATE, FERRITIN, TIBC, IRON, RETICCTPCT in the last 72 hours.  Coagulation profile  Recent Labs Lab 2015-10-03 1448  INR 1.51*    No results for input(s): DDIMER in the last 72 hours.  Cardiac Enzymes No results for input(s): CKMB, TROPONINI, MYOGLOBIN in the last 168 hours.  Invalid input(s): CK ------------------------------------------------------------------------------------------------------------------ Invalid input(s): POCBNP   CBG:  Recent Labs Lab 2015-10-03 1030 2015/10/03 1437 10/03/2015 1620  GLUCAP 108* 91 130*       Studies: Dg Abd 1 View  10/03/15  CLINICAL DATA:  Enteric tube  placement EXAM: ABDOMEN - 1 VIEW COMPARISON:  08/23/2015 abdominal radiograph FINDINGS: Enteric tube terminates in the body of the stomach. No dilated small bowel loops. Minimal colonic stool. No evidence of pneumatosis or pneumoperitoneum. IMPRESSION: Enteric tube terminates in the body of the stomach. Nonobstructive bowel gas pattern. Electronically Signed   By: Delbert Phenix M.D.   On: 2015-10-03 14:19   Ct Head Wo Contrast  10-03-2015  CLINICAL DATA:  Altered mental status. Recent left thalamus hemorrhagic infarct EXAM: CT HEAD WITHOUT CONTRAST TECHNIQUE: Contiguous axial images were obtained from the base of the skull through the vertex without intravenous contrast. COMPARISON:  August 24, 2015 FINDINGS: Brain: There is mild underlying atrophy. There is partial but incomplete resolution of hemorrhage from the left thalamus compared to recent study. There is residual hemorrhage in the left thalamus, less concentrated than on the previous study. There is surrounding cytotoxic edema in the left thalamus. The current focal area containing hemorrhage in the left thalamus measures 2.3 x 1.7 x 2.8 cm. There is localized impression on the third ventricle, essentially stable from previous study. Right ventricle is mildly deviated toward the right. Lateral ventricles remain in the midline. A small amount of hemorrhage remains in the atrium of the left lateral ventricle, much less than on the previous study. Most of the hemorrhage has resolved from the left lateral ventricle. No new hemorrhagic foci are identified. There is no demonstrable mass or extra-axial fluid collection. There is small vessel disease throughout the centra semiovale bilaterally. No new infarct evident. Vascular: There are small foci of calcification in the cavernous carotid arteries bilaterally. No hyperdense vessels are identified. Skull: The bony calvarium appears intact. Sinuses/Orbits: Orbits appear symmetric bilaterally. There is opacification in  left ethmoid air cell anteriorly. Other paranasal sinuses are clear. Other: Mastoid air cells are clear. IMPRESSION:  There is less hemorrhage in the left thalamus and left lateral ventricle compared to most recent study. There is evidence of diffusion of hemorrhage in the left thalamus, and currently there is only slight hemorrhage in the atrium the left lateral ventricle. There remains mild mass effect on the third ventricle with slight midline shift of the third ventricle without midline shift of structures. There is underlying atrophy with periventricular small vessel disease. No new hemorrhage or new infarct. There are foci of vascular calcification. There is mild ethmoid sinus disease on the left anteriorly. Electronically Signed   By: Bretta BangWilliam  Woodruff III M.D.   On: 09/06/2015 09:55   [place] [facility]al  09/29/2015  CLINICAL DATA:  Hypertension, renal failure. EXAM: RENAL / URINARY TRACT ULTRASOUND COMPLETE COMPARISON:  None. FINDINGS: Right Kidney: Length: 14.3 small cm. Multiple septated cystic lesions within the RIGHT kidney measuring up to 6.7 cm. Septations are slightly thickened and mildly nodular with minimal blood flow. No hydronephrosis. Left Kidney: Length: 11.6 cm. 4.6 cm cystic lesion of the LEFT kidney is anechoic without septation. Smaller 2 cm cystic lesion. Bladder: Appears normal for degree of bladder distention. IMPRESSION: 1. Complex cystic lesion of the RIGHT kidney with thickened septations. Recommend CT or MRI with without contrast for further characterization to exclude cystic renal neoplasm. No comparison available. 2. No hydronephrosis. Electronically Signed   By: Genevive BiStewart  Edmunds M.D.   On: 09/07/2015 14:54   Dg Chest Portable 1 View  09/04/2015  CLINICAL DATA:  Altered mental status EXAM: PORTABLE CHEST 1 VIEW COMPARISON:  August 29, 2015 FINDINGS: There is no edema or consolidation. The heart size and pulmonary vascularity are normal. No adenopathy. No bone lesions. IMPRESSION: No edema or  consolidation. Electronically Signed   By: Bretta BangWilliam  Woodruff III M.D.   On: 09/24/2015 10:30      Lab Results  Component Value Date   HGBA1C 5.8* 08/21/2015   HGBA1C 5.6 06/12/2014   Lab Results  Component Value Date   LDLCALC 157* 08/21/2015   CREATININE 14.68* 09/09/2015       Scheduled Meds:  Continuous Infusions:    LOS: 1 day    Time spent: >30 MINS    [facility],Srah Ake  [facility] Pager [phone]. If 7PM-7AM, please contact night-coverage at [email or web address], password [email or web address] 09/09/2015, 11:33 AM  LOS: 1 day

## 2015-09-10 LAB — HEMOGLOBIN A1C
HEMOGLOBIN A1C: 5.8 % — AB (ref 4.8–5.6)
Mean Plasma Glucose: 120 mg/dL

## 2015-09-10 LAB — URINE CULTURE: Culture: >=100000 — AB

## 2015-09-13 LAB — CULTURE, BLOOD (ROUTINE X 2)
CULTURE: NO GROWTH
CULTURE: NO GROWTH

## 2015-10-02 NOTE — Discharge Summary (Signed)
DEATH NOTE: For a complete accounting of the patient's history and physical exam on presentation please refer to the H&P dictated on 09/30/2015. 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 He was discharged on 6/28 to a skilled nursing facility for ongoing rehabilitation. Patient presented back to hospital with decreased level of alertness and ultimately found to have acute renal failure on top of chronic renal failure as well as hyperkalemia and metabolic acidosis. A lengthy discussion was held by myself with the patient's mother regarding goals of care. At that time she wished to make the patient full comfort care and DO NOT RESUSCITATE status. She decided the patient had lost desire to eat or take any nutritional sustenance which she had noticed in the days since discharge from hospital. The palliative medicine service was consulted to assist in the care of this patient. Per documentation patient was pronounced deceased at 2:10 AM on 09/08/2015 with two bedside nurses confirming. Staff attempted to contact the patient's mother.  DIAGNOSES AT DEATH: 1. Hyperkalemia 2. Metabolic Acidosis 3. Acute Toxic Metabolic Encephalopathy 4. Hypernatremia 5. Hyperglycemia 6. Acute Renal Failure 7. Malnourishment 8. H/O Recent Admission w/ Left Cerebral Stroke 9. Thrombocytopenia

## 2015-10-02 NOTE — Clinical Documentation Improvement (Signed)
Internal Medicine  Presents with AMS, metabolic derangement with ARF, Hypernatremia (166) and Hyperkalemia (> 7.5). Based on the clinical findings, please document any associated diagnoses/conditions the patient has or may have.   Metabolic Encephalopathy  Coma  Other  Clinically Undetermined  Supporting Information:  Please exercise your independent, professional judgment when responding. A specific answer is not anticipated or expected.  Thank You, Ruthine DoseEileen Iniko Robles RN, BSN, CCDS Clinical Documentation Specialist [facility] [phone]; cell [phone]

## 2015-10-02 NOTE — Clinical Documentation Improvement (Signed)
Internal Medicine  Please clarify the clinical significance if any of the patient's "Cytotoxic Edema" noted in CT Head w/o Contrast and  document findings in next progress note. Thank you!   Other  Clinically Undetermined  Supporting Information:  Patient found with AMS and a serum Na+ of 166  Please exercise your independent, professional judgment when responding. A specific answer is not anticipated or expected.  Thank You, Ruthine DoseEileen Ahriyah Vannest RN, BSN, CCDS Clinical Documentation Specialist [facility] [phone]; cell [phone]

## 2015-10-02 NOTE — Progress Notes (Addendum)
Patient pronounced dead at 0210 with Elana AlmN. Iris, RN confirming.  [facility] Floor coverage MD paged, patient's mother and brother called at their respective mobile numbers twice but no answer and left voice messages.  [facility] was made aware of death with referral no. [id number] as potential donor.  Awaiting call from Donation Coordinator as per Madelaine BhatAdam of [facility].

## 2015-10-02 NOTE — Progress Notes (Signed)
Vernona RiegerLaura, [facility] Donation Coordinator called back informing this RN that dead patient is potential donor for eyes and tissues.  Vernona RiegerLaura will call patient's mother of potential organ donation and requested to hold release body to [facility] until she had talked to patient's family.  Eye preparation completed. Mignon PineSally Vergara, patient's mother provided [facility] but unsure.  [facility] and [facility] location in [place] per internet search.  Death Certificate already completed and will notify Bed Placement as soon body preparation is complete.

## 2015-10-02 NOTE — Progress Notes (Signed)
Confirmed with patient's mother, Mignon PineSally Moger  that the [facility] located at [address], [place], [place] [address].  Bed Placement was informed accordingly.

## 2015-10-02 DEATH — deceased

## 2015-10-29 ENCOUNTER — Ambulatory Visit: Payer: Self-pay | Admitting: Neurology

## 2016-08-14 IMAGING — CT CT HEAD W/O CM
4 series · 15 of 47 positions shown, 17 images · non-contrast
Comparison: August 24, 2015

CLINICAL DATA: Altered mental status. Recent left thalamus
hemorrhagic infarct

EXAM:
CT HEAD WITHOUT CONTRAST
TECHNIQUE: Contiguous axial images were obtained from the base of the skull
through the vertex without intravenous contrast.

[Series 2: head without · axial · non-contrast · 0.43mm/px · z∈[+168,+293]mm · 7 of 35 slices shown, 9 images]
[im 5/35  brain]
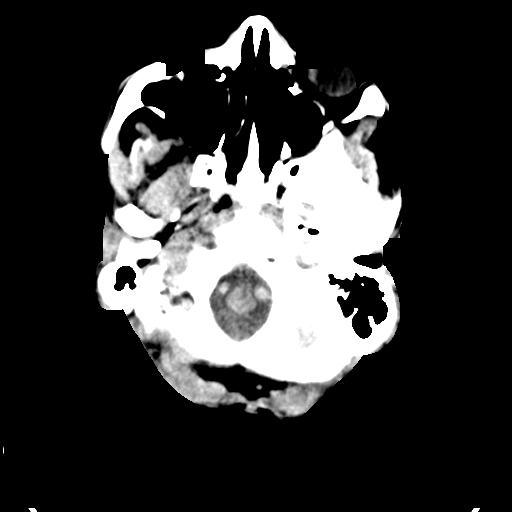
[im 5/35  bone]
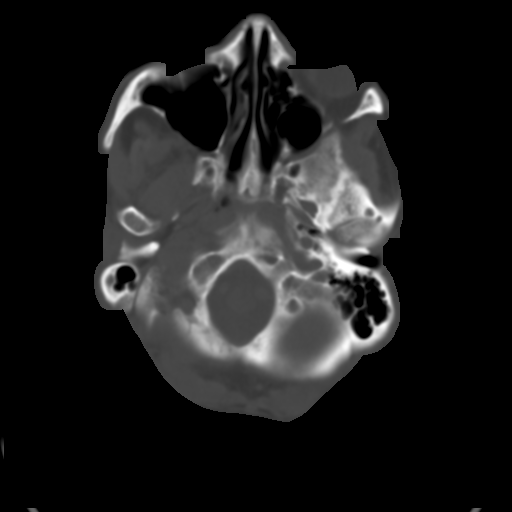
[im 9/35  brain]
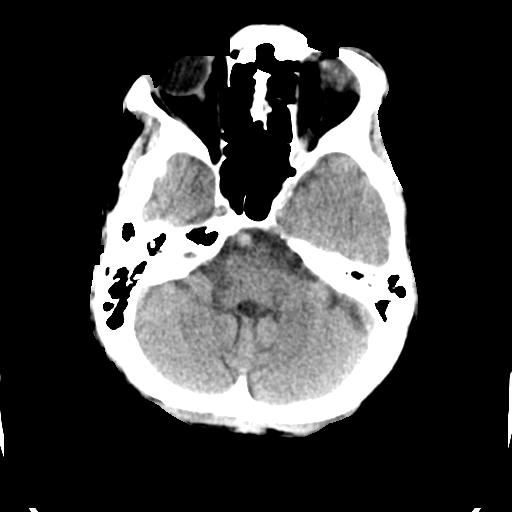
[im 13/35  brain]
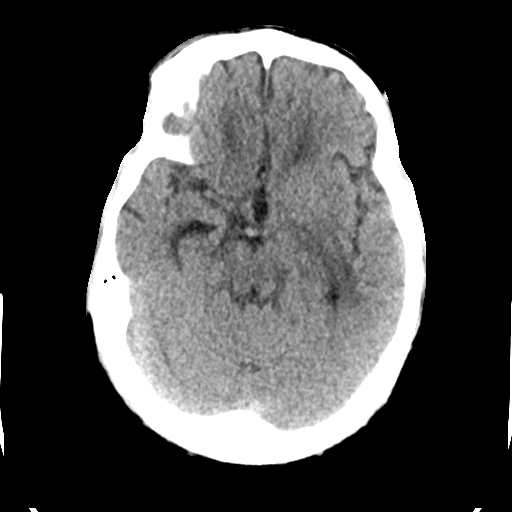
[im 18/35  brain]
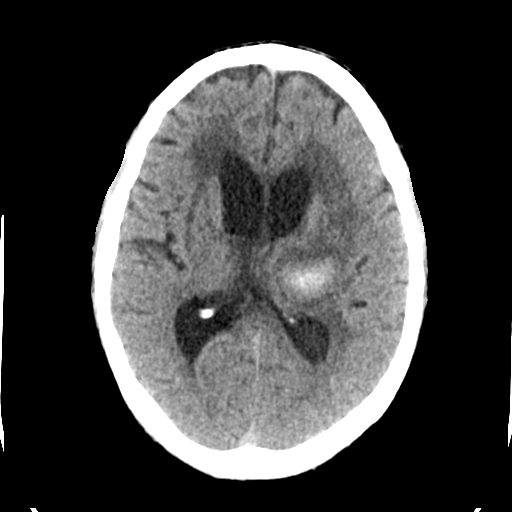
[im 22/35  brain]
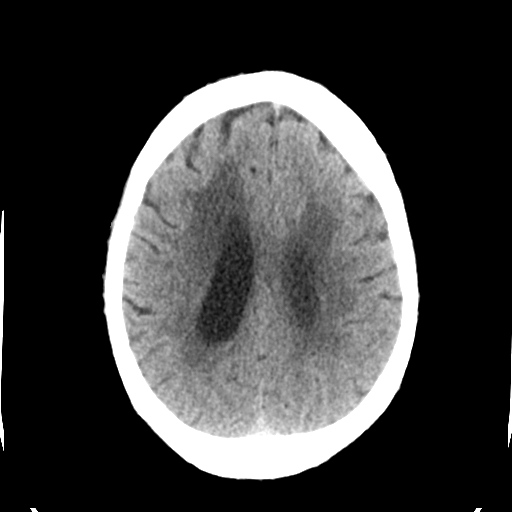
[im 22/35  bone]
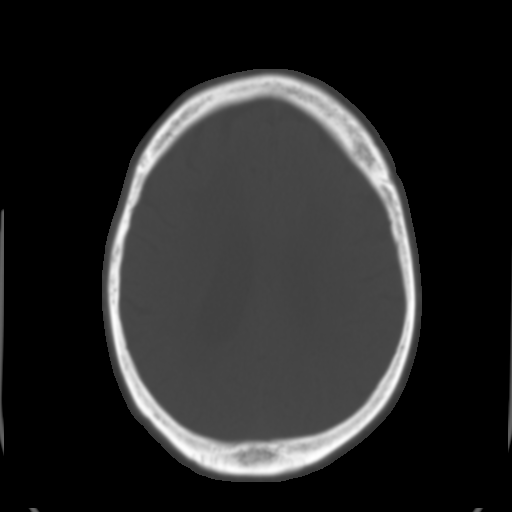
[im 26/35  brain]
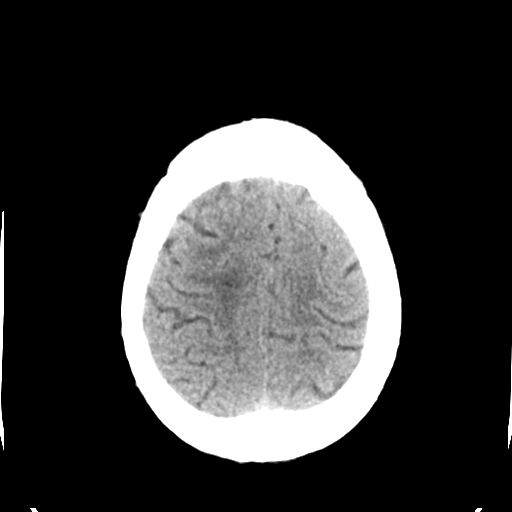
[im 30/35  brain]
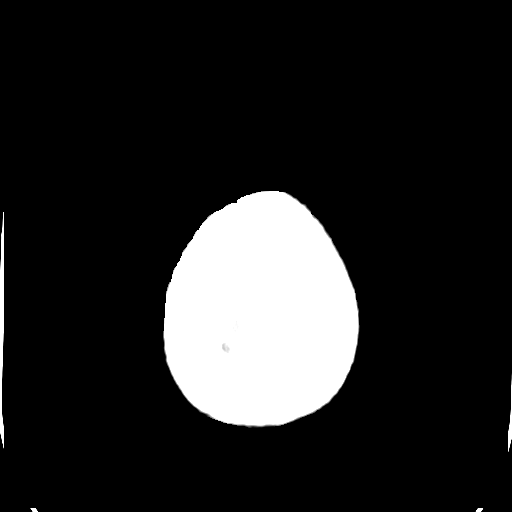

[Series 3: head bone · axial · 0.43mm/px · z∈[+164,+182]mm · 2 of 88 slices shown]
[im 9/88  bone]
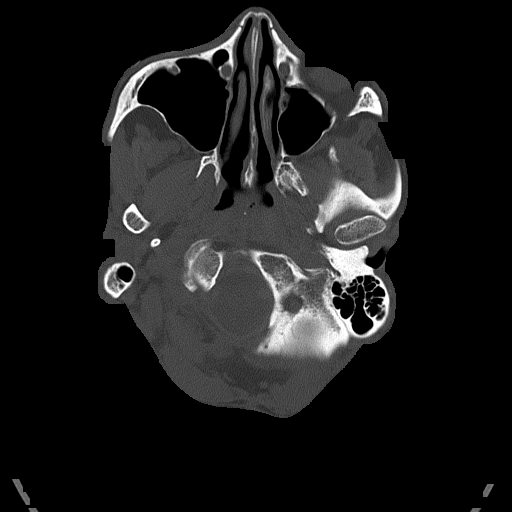
[im 18/88  bone]
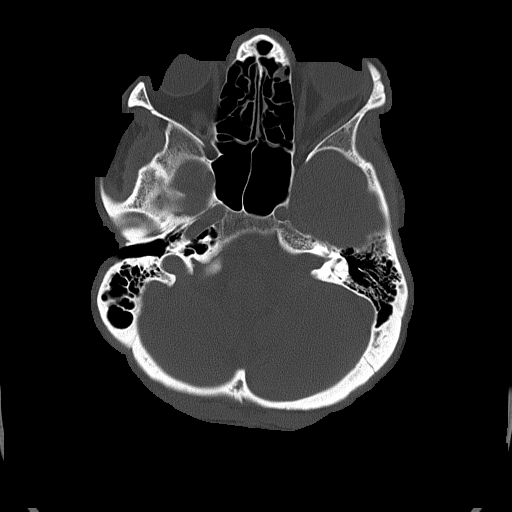

[Series 4: head without cor · coronal · non-contrast · 0.32mm/px · 3 of 68 slices shown]
[im 23/68  brain]
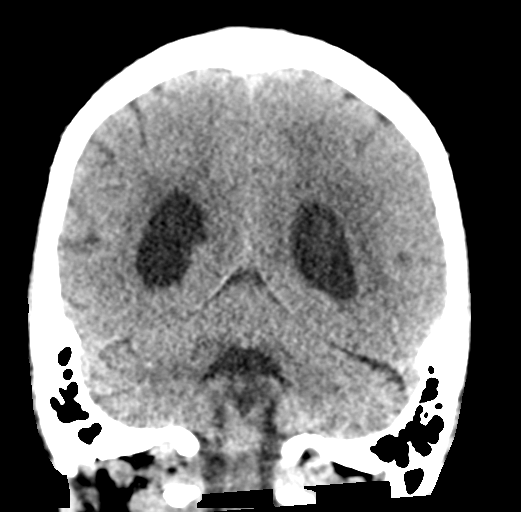
[im 30/68  brain]
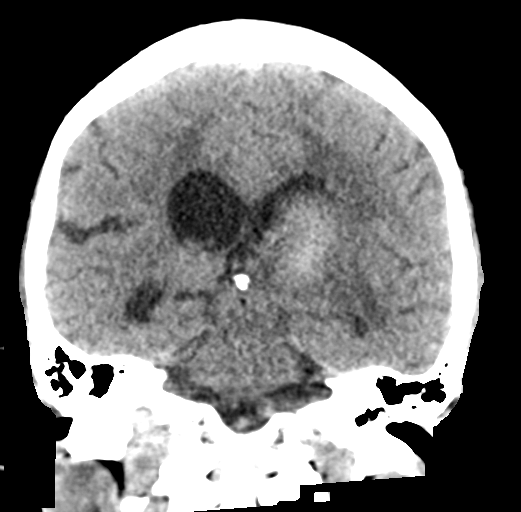
[im 38/68  brain]
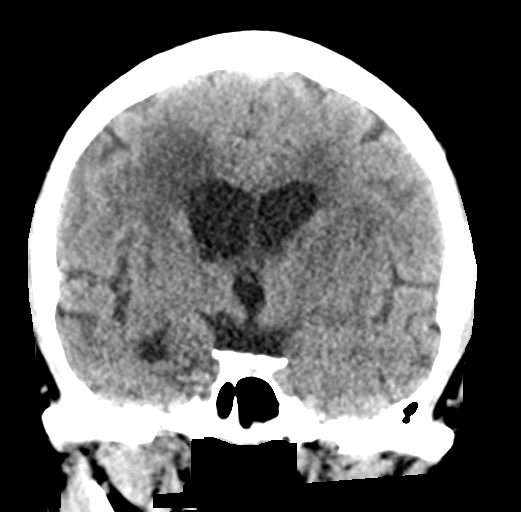

[Series 5: head without sag · sagittal · non-contrast · 0.31mm/px · 3 of 54 slices shown]
[im 22/54  brain]
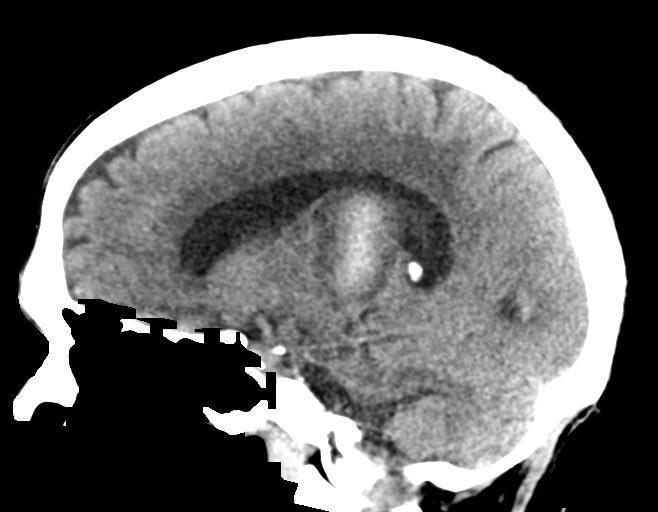
[im 27/54  brain]
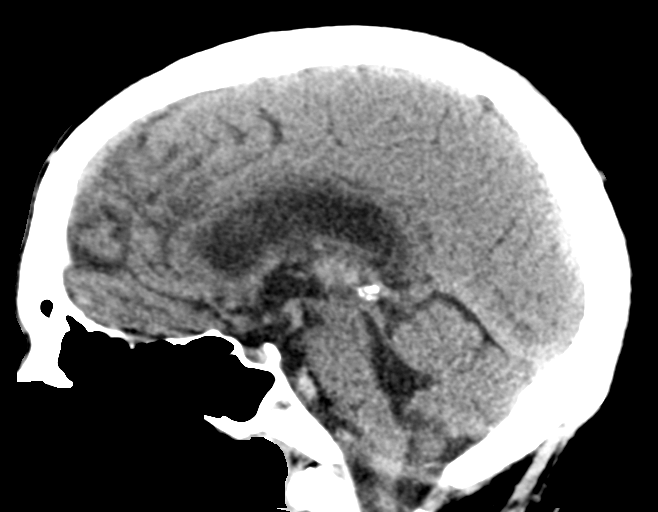
[im 33/54  brain]
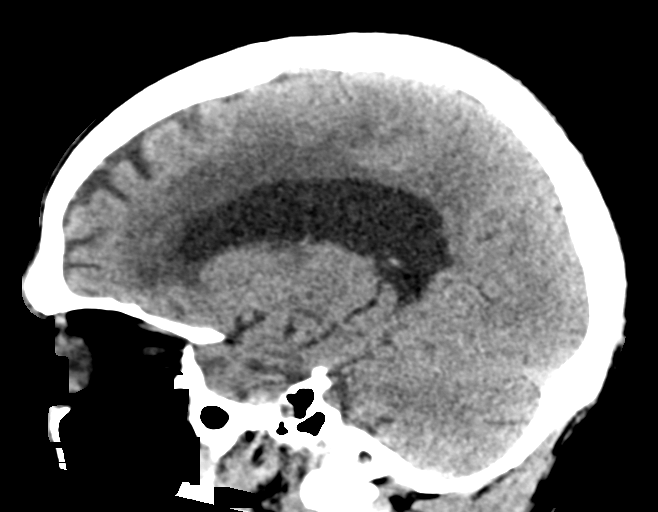

[15 of 47 positions shown; findings below may reference images not displayed]

FINDINGS: Brain: There is mild underlying atrophy. There is partial but
incomplete resolution of hemorrhage from the left thalamus compared
to recent study. There is residual hemorrhage in the left thalamus,
less concentrated than on the previous study. There is surrounding
cytotoxic edema in the left thalamus. The current focal area
containing hemorrhage in the left thalamus measures 2.3 x 1.7 x
cm. There is localized impression on the third ventricle,
essentially stable from previous study. Right ventricle is mildly
deviated toward the right. Lateral ventricles remain in the midline.
A small amount of hemorrhage remains in the atrium of the left
lateral ventricle, much less than on the previous study. Most of the
hemorrhage has resolved from the left lateral ventricle. No new
hemorrhagic foci are identified. There is no demonstrable mass or
extra-axial fluid collection. There is small vessel disease
throughout the centra semiovale bilaterally. No new infarct evident.

Vascular: There are small foci of calcification in the cavernous
carotid arteries bilaterally. No hyperdense vessels are identified.

Skull: The bony calvarium appears intact.

Sinuses/Orbits: Orbits appear symmetric bilaterally. There is
opacification in left ethmoid air cell anteriorly. Other paranasal
sinuses are clear.

Other: Mastoid air cells are clear.
IMPRESSION: There is less hemorrhage in the left thalamus and left lateral
ventricle compared to most recent study. There is evidence of
diffusion of hemorrhage in the left thalamus, and currently there is
only slight hemorrhage in the atrium the left lateral ventricle.
There remains mild mass effect on the third ventricle with slight
midline shift of the third ventricle without midline shift of
structures. There is underlying atrophy with periventricular small
vessel disease. No new hemorrhage or new infarct. There are foci of
vascular calcification. There is mild ethmoid sinus disease on the
left anteriorly.
